# Patient Record
Sex: Male | Born: 1935 | Race: White | Hispanic: No | Marital: Married | Smoking: Former smoker
Health system: Southern US, Community
[De-identification: ages and names within clinical notes are randomized; demographics above are authoritative.]

## PROBLEM LIST (undated history)

## (undated) DIAGNOSIS — I2699 Other pulmonary embolism without acute cor pulmonale: Secondary | ICD-10-CM

## (undated) DIAGNOSIS — C61 Malignant neoplasm of prostate: Secondary | ICD-10-CM

## (undated) DIAGNOSIS — I82409 Acute embolism and thrombosis of unspecified deep veins of unspecified lower extremity: Secondary | ICD-10-CM

## (undated) DIAGNOSIS — E785 Hyperlipidemia, unspecified: Secondary | ICD-10-CM

## (undated) DIAGNOSIS — I10 Essential (primary) hypertension: Secondary | ICD-10-CM

## (undated) HISTORY — PX: APPENDECTOMY: SHX54

## (undated) HISTORY — DX: Hyperlipidemia, unspecified: E78.5

## (undated) HISTORY — PX: TRANSURETHRAL RESECTION OF PROSTATE: SHX73

## (undated) HISTORY — PX: HERNIA REPAIR: SHX51

## (undated) HISTORY — PX: TONSILLECTOMY AND ADENOIDECTOMY: SUR1326

---
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
## 1998-04-02 ENCOUNTER — Other Ambulatory Visit: Admission: RE | Admit: 1998-04-02 | Discharge: 1998-04-02 | Payer: Self-pay | Admitting: Urology

## 2004-01-20 ENCOUNTER — Inpatient Hospital Stay (HOSPITAL_COMMUNITY): Admission: RE | Admit: 2004-01-20 | Discharge: 2004-01-21 | Payer: Self-pay | Admitting: Urology

## 2004-01-20 ENCOUNTER — Encounter (INDEPENDENT_AMBULATORY_CARE_PROVIDER_SITE_OTHER): Payer: Self-pay | Admitting: Specialist

## 2005-04-18 ENCOUNTER — Ambulatory Visit: Payer: Self-pay | Admitting: Gastroenterology

## 2005-04-25 ENCOUNTER — Encounter (INDEPENDENT_AMBULATORY_CARE_PROVIDER_SITE_OTHER): Payer: Self-pay | Admitting: *Deleted

## 2005-04-25 ENCOUNTER — Ambulatory Visit: Payer: Self-pay | Admitting: Gastroenterology

## 2008-06-05 ENCOUNTER — Encounter (HOSPITAL_COMMUNITY): Admission: RE | Admit: 2008-06-05 | Discharge: 2008-07-03 | Payer: Self-pay | Admitting: Urology

## 2008-07-04 ENCOUNTER — Ambulatory Visit: Admission: RE | Admit: 2008-07-04 | Discharge: 2008-09-22 | Payer: Self-pay | Admitting: Radiation Oncology

## 2008-07-09 ENCOUNTER — Ambulatory Visit (HOSPITAL_COMMUNITY): Admission: RE | Admit: 2008-07-09 | Discharge: 2008-07-09 | Payer: Self-pay | Admitting: Urology

## 2008-09-22 ENCOUNTER — Ambulatory Visit: Admission: RE | Admit: 2008-09-22 | Discharge: 2008-10-29 | Payer: Self-pay | Admitting: Radiation Oncology

## 2009-05-20 ENCOUNTER — Ambulatory Visit: Payer: Self-pay | Admitting: Vascular Surgery

## 2011-03-01 NOTE — Procedures (Signed)
DUPLEX DEEP VENOUS EXAM - LOWER EXTREMITY   INDICATION:  Left leg edema.   HISTORY:  Edema:  Periodic episodes of left leg edema.  Trauma/Surgery:  Patient had a trauma to the left foot 10 years ago.  Pain:  No.  PE:  No.  Previous DVT:  No.  Anticoagulants:  Aspirin every other day.  Other:  No.   DUPLEX EXAM:                CFV   SFV   PopV  PTV    GSV                R  L  R  L  R  L  R   L  R  L  Thrombosis    o  o     o     +      +     o  Spontaneous   +  +     +     +      +     +  Phasic        +  +     +     +      +     +  Augmentation  +  +     +     +      +     +  Compressible  +  +     +     o      p     +  Competent     +  +     +     o      o     +   Legend:  + - yes  o - no  p - partial  D - decreased    IMPRESSION:  1. 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  A left popliteal vein is      incompetent.  The thrombus extends into the proximal third of the      calf.  2. No evidence of left leg Baker's cyst.         _____________________________  Larina Earthly, M.D.   MC/MEDQ  D:  05/20/2009  T:  05/20/2009  Job:  [id number]

## 2011-03-04 NOTE — Discharge Summary (Signed)
NAME:  Alexander Cole, Alexander Cole                     ACCOUNT NO.:  [id number]   MEDICAL RECORD NO.:  [id number]                   PATIENT TYPE:  INP   LOCATION:  0354                                 FACILITY:  [facility]   PHYSICIAN:  Jamison Neighbor, M.D.               DATE OF BIRTH:  06/21/1926   DATE OF ADMISSION:  01/20/2004  DATE OF DISCHARGE:  01/21/2004                                 DISCHARGE SUMMARY   DISCHARGE DIAGNOSES:  1. Carcinoma of the prostate.  2. Urinary retention.  3. Hypertension.   PRINCIPAL PROCEDURES:  Cystoscopy and TURP.   HISTORY:  This 77 year old male has had problems with urinary retention and  is being admitted for a treatment for his bladder outlet obstruction and for  a TURP.  The patient's Past Medical History is remarkable for hypertension,  elevated cholesterol.  Previous surgeries include surgery on the right  testicle as well as appendectomy and tonsillectomy.  His medications at the  time of admission were aspirin which had been held, Cardura, and Zocor.  Family History and Social History and Review of Systems are noncontributory.   Initial examination shows a well-healed right lower quadrant incision, is  otherwise unremarkable.  We do note enlargement of the prostate but there  was no palpable evidence of carcinoma.   The patient was felt to be ready to go to the operating room and was taken  to the OR on April 5th.  The patient underwent cystoscopy, placement of  suprapubic tube, and a TURP.  His postoperative course was unremarkable.  The patient's Foley catheter drained just barely light pink urine and a  continuous bladder irrigation was discontinued on postoperative day #1 and  he was sent home for a voiding trial.  He was sent home with the antibiotics  that he had at home as well as Lorcet Plus.  He was given instructions to  record all amounts he voided as well as all residual urines obtained by the  suprapubic tube.  He will return in  follow up and will have the tube removed  if emptying well.                                               Jamison Neighbor, M.D.    RJE/MEDQ  D:  02/03/2004  T:  02/03/2004  Job:  [id number]

## 2011-03-04 NOTE — Op Note (Signed)
NAME:  Alexander Cole, Alexander Cole                     ACCOUNT NO.:  [id number]   MEDICAL RECORD NO.:  [id number]                   PATIENT TYPE:  INP   LOCATION:  0354                                 FACILITY:  [facility]   PHYSICIAN:  Jamison Neighbor, M.D.               DATE OF BIRTH:  1926-05-31   DATE OF PROCEDURE:  01/20/2004  DATE OF DISCHARGE:                                 OPERATIVE REPORT   PREOPERATIVE DIAGNOSES:  1. Urinary retention.  2. Benign prostatic hypertrophy.   POSTOPERATIVE DIAGNOSES:  1. Urinary retention.  2. Benign prostatic hypertrophy.   PROCEDURES PERFORMED:  1. Cystoscopy.  2. Suprapubic catheter placement.  3. Urethral dilation.  4. Transurethral resection of the prostate.   SURGEON:  Dr. Marcelyn Bruins.   ASSISTANT:  Dr. Thyra Breed.   DRAINS:  1. 20 French Foley catheter as a suprapubic tube to straight drain.  2. 24 French Foley catheter to straight drain.   COMPLICATIONS:  None.   ANESTHESIA:  General endotracheal.   INDICATIONS FOR PROCEDURE:  This is a very pleasant 77 year old male, who  has a longstanding history of benign prostatic hypertrophy with bladder  outlet obstruction.  Specifically, he has been in urinary retention in the  past, requiring Foley catheter.  In addition, he has chronic urinary tract  infections due to incomplete emptying.  His current medical management is  Cardura which has helped somewhat with his bladder outlet obstruction, but  he still carries a 200-300 postvoid residual with significant lower urinary  tract symptoms.  At his most recent office appointment, he had a residual  over 1200 mL and was consented on the risks, benefits, and alternatives of  undergoing transurethral resection of the prostate with concomitant  suprapubic catheter placement for the immediate postoperative period.  The  patient understands these risks and is willing to proceed.   PROCEDURE IN DETAIL:  Following identification by his arm  bracelet, the  patient was brought to the operating room and placed in the supine position.  Here, he received preoperative IV antibiotics and underwent general  endotracheal anesthesia.  He was then moved to the dorsal lithotomy  position, and his perineum and genitalia were prepped and draped in the  usual sterile fashion.  A Lowes retractor was then inserted through the  urethral meatus and passed easily into the bladder.  With gentle upward  pressure on the retractor, the tip of the retractor was easily palpated in  the suprapubic region.  A small transverse incision was then made overlying  the tip of the Lowes retractor.  The retractor was then easily brought  through the subcutaneous fat.  The retractor was opened, and a 20 French  Foley catheter was inserted into the jaws of the retractor.  The retractor  was then brought back through the urethral meatus, and the catheter was seen  to be exiting the urethral meatus.  The 12-degree cystoscope lens was then  inserted through the 25 French sheath and used to follow the Foley catheter  through the urethral meatus and into the bladder until proper position was  visually confirmed.  Then 30 mL was used to fill the Foley balloon of the  new suprapubic tube.  Two 3-0 Ethilon stitches were then placed on either  side of the suprapubic catheter to affix it to the skin.  Following this,  portion of the procedure, Sissy Hoff urethral sounds were used to dilate the  urethral meatus to approximately 30 [place].  This allowed the urethral  meatus to accept the 28 French resectoscope sheath.  The resectoscope was  then assembled utilizing continuous flow with the fluid being Glycine.  Initial inspection of the urethra revealed normal-appearing posterior  urethra other than significant bilobar hypertrophy of the prostate producing  kissing lateral lobes.  Specifically, the right lower lobe was quite large  and protruding significantly into the  bladder.  The median lobe was quite  small in comparison to the two lateral lobes.  Inspection in the bladder  revealed severe 3+ trabeculation with a larger right-sided diverticulum as  well as several small diverticulum within the bladder consistent with the  patient's known bladder outlet obstruction.  Both the right and the left  ureteral orifice were eventually identified during the procedure and found  to be effluxing clear urine bilaterally.  Further cystoscopic evaluation  revealed no other evidence of tumors, foreign bodies, papillary lesions  within the bladder.  We then began the transurethral resection by first  making a channel through the median lobe down to the level of the  verumontanum.  After the patient had an adequate channel, we began resection  first on the right lateral prostate lobe.  In fact, the loop was used to  carefully pull the large, protruding right lateral lobe near the bladder  neck to ensure adequate resection of this portion of the prostate and avoid  damaging the bladder or the ureteral orifice.  Following extensive resection  of the right lateral lobe with coagulation to control bleeding, we were  satisfied that both the median lobe as well as the right lateral lobe were  sufficiently resected near the level of the capsule.  At this time, we  turned our attention to the left lateral lobe as resection time was  approximately 35 minutes.  We then resected the left lateral lobe in its  entirety back to the level of the verumontanum.  Following adequate  hemostasis, we then used the Toomey syringe to irrigate all prostatic chips.  The resectoscope was then reassembled, and several prostatic chips were then  gently removed from the patient's multiple diverticulum using the loop of  the resectoscope.  The Toomey syringe was again used to irrigate any  remaining fragments.  Visualization within the bladder and prostatic urethra at this time showed no remaining  prostatic chips.  With flow turned to the  off position, any other sites of active bleeding were sufficiently  coagulated.  At this time, with all flow off, there was no discernable  active bleeding within the prostatic urethra.  The resectoscope was then  removed.  A 24 French Foley catheter was then inserted with the aid of a  catheter guide.  Good return of pink urine was obtained.  Toomey syringe was  again used to irrigate the bladder until it was sufficiently clear.  Traction was then placed on the Foley catheter, and both the Foley catheter  and suprapubic tube  in continuous bladder irrigation was connected to the  suprapubic tube to drain through the Foley catheter.  This marked  termination of the procedure.  A sterile dressing was applied to the  suprapubic site.  The patient tolerated the procedure well, and there were  no complications.  Please note that Dr. Marcelyn Bruins was present and  participated in the entire procedure as he was the responsible surgeon.   DISPOSITION:  After awakening from general anesthesia, the patient was  transported to the postanesthesia care unit in stable condition.  From here,  he will be transferred to the floor for overnight observation and continuous  bladder irrigation.  If the patient's urine remains clear, the Foley  catheter will be removed in the morning, and the patient may be discharged  to home.     Thyra Breed, MD                            Jamison Neighbor, M.D.    EG/MEDQ  D:  01/20/2004  T:  01/20/2004  Job:  [id number]

## 2011-07-18 LAB — CBC
Hemoglobin: 15.1
RBC: 5.22

## 2013-03-04 ENCOUNTER — Encounter (HOSPITAL_COMMUNITY): Payer: Self-pay | Admitting: Anesthesiology

## 2013-03-04 ENCOUNTER — Inpatient Hospital Stay (HOSPITAL_COMMUNITY): Payer: Medicare Other

## 2013-03-04 ENCOUNTER — Observation Stay (HOSPITAL_COMMUNITY): Payer: Medicare Other

## 2013-03-04 ENCOUNTER — Inpatient Hospital Stay (HOSPITAL_COMMUNITY): Payer: Medicare Other | Admitting: Anesthesiology

## 2013-03-04 ENCOUNTER — Inpatient Hospital Stay (HOSPITAL_COMMUNITY)
Admission: AD | Admit: 2013-03-04 | Discharge: 2013-03-10 | DRG: 330 | Disposition: A | Discharge status: Home / Self Care | Payer: Medicare Other | Source: Ambulatory Visit | Attending: General Surgery | Admitting: General Surgery

## 2013-03-04 ENCOUNTER — Encounter (HOSPITAL_COMMUNITY): Admission: AD | Disposition: A | Discharge status: Home / Self Care | Payer: Self-pay | Source: Ambulatory Visit

## 2013-03-04 DIAGNOSIS — Z823 Family history of stroke: Secondary | ICD-10-CM

## 2013-03-04 DIAGNOSIS — Z86718 Personal history of other venous thrombosis and embolism: Secondary | ICD-10-CM

## 2013-03-04 DIAGNOSIS — Z923 Personal history of irradiation: Secondary | ICD-10-CM

## 2013-03-04 DIAGNOSIS — R112 Nausea with vomiting, unspecified: Secondary | ICD-10-CM

## 2013-03-04 DIAGNOSIS — Z79899 Other long term (current) drug therapy: Secondary | ICD-10-CM

## 2013-03-04 DIAGNOSIS — K929 Disease of digestive system, unspecified: Secondary | ICD-10-CM | POA: Diagnosis not present

## 2013-03-04 DIAGNOSIS — K56609 Unspecified intestinal obstruction, unspecified as to partial versus complete obstruction: Secondary | ICD-10-CM | POA: Diagnosis present

## 2013-03-04 DIAGNOSIS — K430 Incisional hernia with obstruction, without gangrene: Principal | ICD-10-CM | POA: Diagnosis present

## 2013-03-04 DIAGNOSIS — E876 Hypokalemia: Secondary | ICD-10-CM | POA: Diagnosis not present

## 2013-03-04 DIAGNOSIS — Y834 Other reconstructive surgery as the cause of abnormal reaction of the patient, or of later complication, without mention of misadventure at the time of the procedure: Secondary | ICD-10-CM | POA: Diagnosis present

## 2013-03-04 DIAGNOSIS — E785 Hyperlipidemia, unspecified: Secondary | ICD-10-CM | POA: Diagnosis present

## 2013-03-04 DIAGNOSIS — Z860100 Personal history of colon polyps, unspecified: Secondary | ICD-10-CM

## 2013-03-04 DIAGNOSIS — Z8249 Family history of ischemic heart disease and other diseases of the circulatory system: Secondary | ICD-10-CM

## 2013-03-04 DIAGNOSIS — R066 Hiccough: Secondary | ICD-10-CM | POA: Diagnosis not present

## 2013-03-04 DIAGNOSIS — K560 Paralytic ileus: Secondary | ICD-10-CM | POA: Diagnosis not present

## 2013-03-04 DIAGNOSIS — K565 Intestinal adhesions [bands], unspecified as to partial versus complete obstruction: Secondary | ICD-10-CM | POA: Diagnosis present

## 2013-03-04 DIAGNOSIS — R109 Unspecified abdominal pain: Secondary | ICD-10-CM

## 2013-03-04 DIAGNOSIS — Z8546 Personal history of malignant neoplasm of prostate: Secondary | ICD-10-CM

## 2013-03-04 DIAGNOSIS — N179 Acute kidney failure, unspecified: Secondary | ICD-10-CM | POA: Diagnosis present

## 2013-03-04 DIAGNOSIS — R5381 Other malaise: Secondary | ICD-10-CM | POA: Diagnosis not present

## 2013-03-04 DIAGNOSIS — Y9210 Unspecified residential institution as the place of occurrence of the external cause: Secondary | ICD-10-CM | POA: Diagnosis present

## 2013-03-04 DIAGNOSIS — R339 Retention of urine, unspecified: Secondary | ICD-10-CM | POA: Diagnosis not present

## 2013-03-04 DIAGNOSIS — I10 Essential (primary) hypertension: Secondary | ICD-10-CM | POA: Diagnosis present

## 2013-03-04 HISTORY — PX: INCISIONAL HERNIA REPAIR: SHX193

## 2013-03-04 HISTORY — DX: Essential (primary) hypertension: I10

## 2013-03-04 HISTORY — DX: Malignant neoplasm of prostate: C61

## 2013-03-04 HISTORY — DX: Acute embolism and thrombosis of unspecified deep veins of unspecified lower extremity: I82.409

## 2013-03-04 HISTORY — PX: BOWEL RESECTION: SHX1257

## 2013-03-04 HISTORY — PX: LYSIS OF ADHESION: SHX5961

## 2013-03-04 LAB — CBC WITH DIFFERENTIAL/PLATELET
Eosinophils Absolute: 0 10*3/uL (ref 0.0–0.7)
Eosinophils Relative: 0 % (ref 0–5)
HCT: 50.2 % (ref 39.0–52.0)
Hemoglobin: 17.4 g/dL — ABNORMAL HIGH (ref 13.0–17.0)
Lymphocytes Relative: 7 % — ABNORMAL LOW (ref 12–46)
Lymphs Abs: 1 10*3/uL (ref 0.7–4.0)
MCH: 29 pg (ref 26.0–34.0)
MCV: 83.7 fL (ref 78.0–100.0)
Monocytes Absolute: 2.2 10*3/uL — ABNORMAL HIGH (ref 0.1–1.0)
Monocytes Relative: 15 % — ABNORMAL HIGH (ref 3–12)
Platelets: 316 10*3/uL (ref 150–400)
RBC: 6 MIL/uL — ABNORMAL HIGH (ref 4.22–5.81)
WBC: 14.7 10*3/uL — ABNORMAL HIGH (ref 4.0–10.5)

## 2013-03-04 LAB — BASIC METABOLIC PANEL
CO2: 33 mEq/L — ABNORMAL HIGH (ref 19–32)
Calcium: 11 mg/dL — ABNORMAL HIGH (ref 8.4–10.5)
Chloride: 92 mEq/L — ABNORMAL LOW (ref 96–112)
Glucose, Bld: 137 mg/dL — ABNORMAL HIGH (ref 70–99)
Sodium: 140 mEq/L (ref 135–145)

## 2013-03-04 LAB — GLUCOSE, CAPILLARY: Glucose-Capillary: 127 mg/dL — ABNORMAL HIGH (ref 70–99)

## 2013-03-04 SURGERY — LAPAROTOMY, FOR LYSIS OF ADHESIONS
Anesthesia: General | Site: Abdomen | Wound class: Clean Contaminated

## 2013-03-04 MED ORDER — SUCCINYLCHOLINE CHLORIDE 20 MG/ML IJ SOLN
INTRAMUSCULAR | Status: DC | PRN
  Administered 2013-03-04: 120 mg via INTRAVENOUS

## 2013-03-04 MED ORDER — PROPOFOL 10 MG/ML IV BOLUS
INTRAVENOUS | Status: DC | PRN
  Administered 2013-03-04: 140 mg via INTRAVENOUS

## 2013-03-04 MED ORDER — PANTOPRAZOLE SODIUM 40 MG IV SOLR
40.0000 mg | Freq: Two times a day (BID) | INTRAVENOUS | Status: DC
  Administered 2013-03-04 – 2013-03-10 (×13): 40 mg via INTRAVENOUS
  Filled 2013-03-04 (×15): qty 40

## 2013-03-04 MED ORDER — ROCURONIUM BROMIDE 100 MG/10ML IV SOLN
INTRAVENOUS | Status: DC | PRN
  Administered 2013-03-04: 40 mg via INTRAVENOUS
  Administered 2013-03-05: 20 mg via INTRAVENOUS
  Administered 2013-03-05 (×2): 10 mg via INTRAVENOUS

## 2013-03-04 MED ORDER — DEXTROSE 5 % IV SOLN
INTRAVENOUS | Status: AC
  Filled 2013-03-04 (×2): qty 1

## 2013-03-04 MED ORDER — ONDANSETRON HCL 4 MG PO TABS: 4.0000 mg | ORAL_TABLET | Freq: Four times a day (QID) | ORAL | Status: DC | PRN

## 2013-03-04 MED ORDER — ONDANSETRON HCL 4 MG/2ML IJ SOLN: 4.0000 mg | Freq: Four times a day (QID) | INTRAMUSCULAR | Status: DC | PRN

## 2013-03-04 MED ORDER — LIDOCAINE HCL (CARDIAC) 20 MG/ML IV SOLN
INTRAVENOUS | Status: DC | PRN
  Administered 2013-03-04: 20 mg via INTRAVENOUS

## 2013-03-04 MED ORDER — LACTATED RINGERS IV SOLN
INTRAVENOUS | Status: DC | PRN
  Administered 2013-03-04 – 2013-03-05 (×2): via INTRAVENOUS

## 2013-03-04 MED ORDER — MORPHINE SULFATE 2 MG/ML IJ SOLN: 2.0000 mg | INTRAMUSCULAR | Status: DC | PRN

## 2013-03-04 MED ORDER — 0.9 % SODIUM CHLORIDE (POUR BTL) OPTIME
TOPICAL | Status: DC | PRN
  Administered 2013-03-04: 1000 mL
  Administered 2013-03-05: 2000 mL

## 2013-03-04 MED ORDER — ENOXAPARIN SODIUM 40 MG/0.4ML ~~LOC~~ SOLN
40.0000 mg | SUBCUTANEOUS | Status: DC
  Filled 2013-03-04: qty 0.4

## 2013-03-04 MED ORDER — SODIUM CHLORIDE 0.9 % IV SOLN
INTRAVENOUS | Status: DC
  Administered 2013-03-04 – 2013-03-05 (×2): via INTRAVENOUS

## 2013-03-04 SURGICAL SUPPLY — 50 items
BLADE SURG ROTATE 9660 (MISCELLANEOUS) ×3 IMPLANT
CANISTER SUCTION 2500CC (MISCELLANEOUS) ×3 IMPLANT
CHLORAPREP W/TINT 26ML (MISCELLANEOUS) ×3 IMPLANT
CLOTH BEACON ORANGE TIMEOUT ST (SAFETY) ×3 IMPLANT
COVER SURGICAL LIGHT HANDLE (MISCELLANEOUS) ×3 IMPLANT
DERMABOND ADVANCED (GAUZE/BANDAGES/DRESSINGS)
DERMABOND ADVANCED .7 DNX12 (GAUZE/BANDAGES/DRESSINGS) IMPLANT
DRAIN CHANNEL 19F RND (DRAIN) ×3 IMPLANT
DRAPE LAPAROSCOPIC ABDOMINAL (DRAPES) ×3 IMPLANT
DRAPE UTILITY 15X26 W/TAPE STR (DRAPE) ×6 IMPLANT
DRSG PAD ABDOMINAL 8X10 ST (GAUZE/BANDAGES/DRESSINGS) ×3 IMPLANT
ELECT CAUTERY BLADE 6.4 (BLADE) ×6 IMPLANT
ELECT REM PT RETURN 9FT ADLT (ELECTROSURGICAL) ×3
ELECTRODE REM PT RTRN 9FT ADLT (ELECTROSURGICAL) ×2 IMPLANT
EVACUATOR SILICONE 100CC (DRAIN) ×3 IMPLANT
GLOVE BIO SURGEON STRL SZ8 (GLOVE) ×9 IMPLANT
GLOVE BIOGEL M STRL SZ7.5 (GLOVE) ×9 IMPLANT
GLOVE BIOGEL PI IND STRL 8 (GLOVE) ×6 IMPLANT
GLOVE BIOGEL PI INDICATOR 8 (GLOVE) ×3
GOWN PREVENTION PLUS XLARGE (GOWN DISPOSABLE) ×3 IMPLANT
GOWN STRL NON-REIN LRG LVL3 (GOWN DISPOSABLE) IMPLANT
GOWN STRL REIN 3XL LVL4 (GOWN DISPOSABLE) ×3 IMPLANT
KIT BASIN OR (CUSTOM PROCEDURE TRAY) ×3 IMPLANT
KIT ROOM TURNOVER OR (KITS) ×3 IMPLANT
LIGASURE IMPACT 36 18CM CVD LR (INSTRUMENTS) ×3 IMPLANT
NEEDLE HYPO 25GX1X1/2 BEV (NEEDLE) ×3 IMPLANT
NS IRRIG 1000ML POUR BTL (IV SOLUTION) ×9 IMPLANT
PACK GENERAL/GYN (CUSTOM PROCEDURE TRAY) ×3 IMPLANT
PAD ARMBOARD 7.5X6 YLW CONV (MISCELLANEOUS) ×3 IMPLANT
RELOAD PROXIMATE 55MM GREEN (ENDOMECHANICALS) ×6 IMPLANT
SEPRAFILM PROCEDURAL PACK 3X5 (MISCELLANEOUS) ×3 IMPLANT
SPONGE GAUZE 4X4 12PLY (GAUZE/BANDAGES/DRESSINGS) ×3 IMPLANT
STAPLER PROXIMATE 55 BLUE (STAPLE) ×3 IMPLANT
STAPLER VISISTAT 35W (STAPLE) IMPLANT
SUT MNCRL AB 4-0 PS2 18 (SUTURE) IMPLANT
SUT NOVA 1 T20/GS 25DT (SUTURE) ×3 IMPLANT
SUT NOVA NAB GS-21 0 18 T12 DT (SUTURE) ×3 IMPLANT
SUT PDS AB 1 TP1 96 (SUTURE) ×6 IMPLANT
SUT PROLENE 0 CT 2 (SUTURE) ×3 IMPLANT
SUT SILK 2 0 SH CR/8 (SUTURE) ×6 IMPLANT
SUT SILK 3 0 SH CR/8 (SUTURE) ×3 IMPLANT
SUT VIC AB 3-0 SH 27 (SUTURE) ×1
SUT VIC AB 3-0 SH 27X BRD (SUTURE) ×2 IMPLANT
SYR CONTROL 10ML LL (SYRINGE) ×3 IMPLANT
TAPE CLOTH SURG 4X10 WHT LF (GAUZE/BANDAGES/DRESSINGS) ×3 IMPLANT
TOWEL OR 17X24 6PK STRL BLUE (TOWEL DISPOSABLE) ×3 IMPLANT
TOWEL OR 17X26 10 PK STRL BLUE (TOWEL DISPOSABLE) ×3 IMPLANT
TRAY FOLEY CATH 14FRSI W/METER (CATHETERS) ×3 IMPLANT
WATER STERILE IRR 1000ML POUR (IV SOLUTION) IMPLANT
YANKAUER SUCT BULB TIP NO VENT (SUCTIONS) ×3 IMPLANT

## 2013-03-04 NOTE — Anesthesia Preprocedure Evaluation (Addendum)
Anesthesia Evaluation  Patient identified by MRN, date of birth, ID band Patient awake    Reviewed: Allergy & Precautions, H&P , NPO status , Patient's Chart, lab work & pertinent test results  History of Anesthesia Complications Negative for: history of anesthetic complications  Airway Mallampati: II TM Distance: >3 FB Neck ROM: Full    Dental  (+) Dental Advisory Given and Poor Dentition   Pulmonary neg pulmonary ROS,  breath sounds clear to auscultation  Pulmonary exam normal       Cardiovascular hypertension, Pt. on medications Rhythm:Regular Rate:Normal     Neuro/Psych negative neurological ROS  negative psych ROS   GI/Hepatic Neg liver ROS, Small bowel obstruction n/v   Endo/Other  negative endocrine ROS  Renal/GU Renal InsufficiencyRenal disease (creat 1.48)   Prostate ca: surgery and XRT    Musculoskeletal   Abdominal   Peds  Hematology   Anesthesia Other Findings   Reproductive/Obstetrics                         Anesthesia Physical Anesthesia Plan  ASA: III  Anesthesia Plan: General   Post-op Pain Management:    Induction: Intravenous and Rapid sequence  Airway Management Planned: Oral ETT  Additional Equipment:   Intra-op Plan:   Post-operative Plan: Possible Post-op intubation/ventilation  Informed Consent: I have reviewed the patients History and Physical, chart, labs and discussed the procedure including the risks, benefits and alternatives for the proposed anesthesia with the patient or authorized representative who has indicated his/her understanding and acceptance.   Dental advisory given  Plan Discussed with: Surgeon and CRNA  Anesthesia Plan Comments: (Plan routine monitors, GETA )        Anesthesia Quick Evaluation

## 2013-03-04 NOTE — Progress Notes (Signed)
Subjective: Asked to f/u on CT of a/p by Dr Dwain Sarna. Pt asleep but doesn't c/o abd pain. No flatus. No urination since being at hospital. Bladder scan - 1L  Objective: Vital signs in last 24 hours: Temp:  [97.5 F (36.4 C)-98.2 F (36.8 C)] 98.2 F (36.8 C) (05/19 2058) Pulse Rate:  [102-113] 111 (05/19 2058) Resp:  [20] 20 (05/19 2058) BP: (128-133)/(70-81) 128/71 mmHg (05/19 2058) SpO2:  [92 %-95 %] 92 % (05/19 2058) Weight:  [215 lb 13.3 oz (97.9 kg)] 215 lb 13.3 oz (97.9 kg) (05/19 1414) Last BM Date: 03/03/13  Intake/Output from previous day:   Intake/Output this shift: Total I/O In: -  Out: 1000 [Emesis/NG output:1000]  Asleep, easily arousable Tachy, no fever cta  Soft, jumps a little when i press on hernia. No peritonitis. No BS. Mild distension. Subsequently doesn't grimace when palpate hernia  Lab Results:   Recent Labs  03/04/13 1933  WBC 14.7*  HGB 17.4*  HCT 50.2  PLT 316   BMET  Recent Labs  03/04/13 1933  NA 140  K 3.4*  CL 92*  CO2 33*  GLUCOSE 137*  BUN 72*  CREATININE 1.48*  CALCIUM 11.0*   PT/INR No results found for this basename: LABPROT, INR,  in the last 72 hours ABG No results found for this basename: PHART, PCO2, PO2, HCO3,  in the last 72 hours  Studies/Results: Ct Abdomen Pelvis Wo Contrast  03/04/2013   *RADIOLOGY REPORT*  Clinical Data:  Right lower quadrant pain with palpation, nausea, vomiting, question small bowel obstruction  CT ABDOMEN AND PELVIS WITHOUT CONTRAST  Technique:  Multidetector CT imaging of the abdomen and pelvis was performed following the standard protocol without intravenous contrast. Sagittal and coronal MPR images reconstructed from axial data set.  Comparison: 07/09/2008  Findings: Bibasilar atelectasis. Nasogastric tube extends into a dilated stomach. Large cyst upper pole left kidney 5.7 x 4.7 cm image 36. Liver, spleen, pancreas, kidneys, and adrenal glands otherwise unremarkable for nonenhanced  exam. Small umbilical hernia containing fat.  Small right parasagittal supraumbilical ventral hernia with fascial defect 1.9 cm diameter, through which a small bowel loop herniates. This is associated with small bowel obstruction at the herniated segment, with dilated proximal small bowel loops in decompressed distal small bowel loops. Colon is decompressed with appendix not definitely localized. Sigmoid and distal descending colonic diverticulosis without evidence of diverticulitis. Small amount of mesenteric edema is seen associated with the herniated small bowel segment as well as a small amount of subcutaneous edema in the anterolateral right abdominal wall lateral to the hernia sac.  Diverticulum at dome of urinary bladder, 6.9 cm greatest diameter. Surgical clips at prostate bed question prostatectomy. Bones appear demineralized. No mass, adenopathy, or free air.  IMPRESSION: High-grade small bowel obstruction secondary to a herniated small bowel segment within a right parasagittal supraumbilical ventral hernia with significant dilatation of the proximal small bowel loops and stomach. No evidence of perforation or definite bowel wall thickening. Large left renal cyst. Bibasilar atelectasis. Distal descending and sigmoid colonic diverticulosis. Question prior prostatectomy. 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   Original Report Authenticated By: Ulyses Southward, M.D.   Acute Abdominal Series  03/04/2013   *RADIOLOGY REPORT*  Clinical Data: Nausea and vomiting.  Question small bowel obstruction.  ACUTE ABDOMEN SERIES (ABDOMEN 2 VIEW & CHEST 1 VIEW)  Comparison: CT abdomen pelvis without contrast 07/09/2008 and CT chest 2005  Findings: The heart, mediastinal, and hilar contours are normal and stable.  Small nodular  density in the right lung apex appears stable compared to a chest radiograph of 2005, suggesting benign pleuroparenchymal scarring. There is mild bibasilar atelectasis. No airspace disease or pleural  effusion is identified.  There is no free intraperitoneal air. The stomach is massively distended with fluid and contains an air-fluid level.  There are several dilated loops of small bowel in the mid to lower abdomen, measuring up to 4.5 cm.  Relative paucity of colonic gas noted.  No dilated colon.  A cluster of multiple small radiopaque densities projects over the left abdomen at the level of the dilated stomach.  These densities were not present on prior CT abdomen of 2009. Possible ingested particles within the ankle or bowel.  No free intraperitoneal air is identified.  Degenerative changes of the lumbar spine.  IMPRESSION: 1. Findings compatible with high-grade small bowel obstruction.  It is noted that the stomach is massively distended and contains a large amount of fluid. Findings were discussed with the patient's nurse, McKenzie at 3:30 p.m. 03/04/2013 by Dr. Mayford Knife.  The clinical team is planning to place a nasogastric tube at this time. 2.  Multiple small radiopaque densities projecting over the left mid abdomen of uncertain etiology.  Question particulate matter layering within the distended stomach. 3. Bibasilar atelectasis.  Stable nodular density at the right lung apex dating back to a study of 2005 likely reflects focal pleuroparenchymal scarring.   Original Report Authenticated By: Britta Mccreedy, M.D.    Anti-infectives: Anti-infectives   None      Assessment/Plan: SBO secondary to Incarcerated ventral incisional hernia ARF Urinary retention Tachycardia  Reviewed CT - has incarcerated incisional ventral hernia. Leukocytosis is probably from hemoconcentration since Hgb 17; however, can't r/o increased wbc is due to intestinal ischemia. While pt does not have peritonitis or rebound, he is now tachycardiac. His HR in PCP's office was 98 now it is consistently 110s.   Explained to pt and wife that he will need surgery to fix hernia and intestinal blockage. He has a complete SBO - no  flatus >1 day. Explained timing of surgery is less clear - tonight vs am.  Explained that if waited til AM it is a possibility that intestines could become strangulated requiring resection. Explained safest option was to operate tonight.    I discussed the procedure in detail with pt and his wife (via phone).    We discussed the risks and benefits of surgery including, but not limited to bleeding, infection (such as wound infection, abdominal abscess), injury to surrounding structures, blood clot formation, urinary retention, incisional hernia, possible anastomotic stricture, possible anastomotic leak, anesthesia risks, pulmonary & cardiac complications such as pneumonia &/or heart attack, recurrent hernia, need for additional procedures, ileus, & prolonged hospitalization.  We discussed the typical postoperative recovery course, including limitations & restrictions postoperatively. I explained that the likelihood of improvement in their symptoms is good.  He has elected to proceed to OR this evening.  Will place foley once asleep.  IV abx on call to OR.   Alexander Cole. Alexander Campanile, MD, FACS General, Bariatric, & Minimally Invasive Surgery [facility], [place]    LOS: 0 days    Alexander Cole 03/04/2013

## 2013-03-04 NOTE — H&P (Addendum)
Vital Signs  Entered weight:  219  lbs., Calculated Weight: 219 lbs., ( 99.34 kg) Height: 72 in., ( 182.88 cm) Temperature: 97.4 deg F, Temperature site: oral Pulse rate: 98 Pulse rhythm: regular  Blood Pressure #1: 142 / 102 mm Hg    BMI: 29.70 BSA: 2.21 Wt Chg: -2.38 lbs since 07/26/2012  Vitals entered by: Leeann Must  CNA on Mar 04, 2013 12:51 PM        History of Present Illness  History from: patient Reason for visit: Work-In Sick Visit Chief Complaint: pt has had bloating and vomiting since saturday, has also had hiccupps, has not been able to eat anything , very weak and a  little disoriented , very hot and clammy yesterday  History of Present Illness: When walking the dog 3 days ago he felt like he had a lot of stomach juices in his mouth.  Tried to do yard work and ate a hamburger.  No appetite and unable to eat dinner that night.  Developed nausea and vomitting of dark black emesis that evening.  Continued for about 24 hours and then resolved last evening around 5pm.  UNable to eat today because he felt full.  Feels bloated with distended abdomen but no abdominal pain.  No flatulencce.  Last BM 2 days ago was normal.     Review of Systems  General:       Complains of anorexia, fatigue, malaise.        Denies fevers, chills, sweats, weight loss.   Ears/Nose/Throat:       Complains of decreased hearing.   Cardiovascular:       Denies chest pains, dyspnea on exertion, peripheral edema.   Respiratory:       Denies dyspnea.   Gastrointestinal:       Complains of see HPI.   Genitourinary:       Denies urinary frequency.   Musculoskeletal:       Denies joint pain.   Skin:       Denies rash.   Neurologic:       Complains of weakness, gait instability.   Endocrine:       Denies polydipsia, polyuria.   Heme/Lymphatic:       Denies bleeding.    Past History Past Medical History (reviewed - no changes required): HTN BPH S/P TURP (4/05) Hyperlipidemia ED Colon  polyp-hyperplastic (7/06) Prostate cancer-XRT (Dx 2009-XRT completed 1/10) (Gleason 4+3=7) LLE DVT- (8/10)-->Coumadin X 6 months, ?related to CA treatment or injury to leg; negative hypercoagulable panel Surgical History (reviewed - no changes required): APPY (1980s) S/P testicle lowering (1940) S/P T&A  TURP Family History (reviewed - no changes required): Father: CVA (9s), Alzheimers, pneumonia Mother: died 63; MI MGF-?Melanoma MGM-old age PGM-bowel obstruction PGF-old age? Sister-adopted, deceased Son-healthy, thyroid Son-healthy Social History (reviewed - no changes required): Married with 2 sons ([place] and [place]), no grandchildren Education: college Occupation: chemist-retired No Tobacco (college) No Alcohol. No Drugs. Enjoys golf   Physical Exam  General appearance: frail, weak  Eyes  External: conjunctivae and lids normal; no scleral icterus Pupils: equal, round, reactive to light and accommodation  Ears, Nose and Throat  External ears: normal, no lesions or deformities External nose: normal, no lesions or deformities Otoscopic: canals clear, tympanic membranes intact, no fluid Nasal: mucosa, septum, and turbinates normal Pharynx: tongue normal, protrudes mid line,  posterior pharynx without erythema or exudate  Neck  Neck: supple, no masses, trachea midline Thyroid: no nodules, masses, tenderness, or enlargement  Respiratory  Respiratory effort: no intercostal retractions or use of accessory muscles Auscultation: no rales, rhonchi, or wheezes  Cardiovascular  Auscultation: S1, S2, no murmur, rub, or gallop Carotid arteries: pulses 2+, symmetric, no bruits Pedal pulses: pulses 2+, symmetric Periph. circulation: no cyanosis, clubbing; left leg>right leg edema (chronic)  Gastrointestinal  Abdomen: distended with hypoactive bowel sounds; right mid abdominal hernia with tenderness; epigastric, left upper quadrant tenderness; no rebound or guarding Liver and  spleen: no enlargement or nodularity; negative Murphy's sign  Lymphatic  Neck: no cervical adenopathy  Musculoskeletal  Gait and station: wheelchair Digits and nails: no clubbing, cyanosis, petechiae, or nodes Head and neck: normal alignment and mobility Spine, ribs, pelvis: normal alignment and mobility, no deformity RUE: normal ROM and strength, no joint enlargement or tenderness LUE: normal ROM and strength, no joint enlargement or tenderness RLE: normal ROM and strength, no joint enlargement or tenderness LLE: normal ROM and strength, no joint enlargement or tenderness  Neurologic  Reflexes: 2+, symmetric  Mental Status Exam  Judgment, insight: intact Mood and affect: Normal Mood  Labs obtained today in office: COMPLETE METABOLIC (1010)   GLUCOSE              [H]  179 mg/dl                   45-409   BUN                  [H]  66 mg/dl                    8-11   CREATININE           [H]  1.8 mg/dl                   9.1-4.7  eGFR Non-African American                             36.1  eGFR African American                             43.7   SODIUM                    140 mEq/L                   135-148   POTASSIUM                 4.1 mEq/L                   3.5-5.3   CHLORIDE                  93 mEq/L                    80-111   CO2                       32 mEq/L                    15-35   CALCIUM              [H]  11.4 mg/dL                  8.2-95.6   TOTAL PROTEIN  7.8 g/dL                    4.6-9.6   ALBUMIN                   3.9 g/dL                    2.9-5.2   AST                       14 IU/L                     7-45   ALT                       10 IU/L                     5-40   ALK PHOS                  62 IU/L                     37-137   TOTAL BILIRUBIN           1.1 mg/dl                   8.4-1.3  Tests: (2) CBC (2000)   WBC                  [HH] 16.20 K/uL                  4.10-10.90     RES=RESULT VERIFIED AND REPORTED TO PHYSICIAN   LYM                        1.4 K/uL                    0.6-4.1 ! MID                       1.7 K/uL                    0.0-1.8   GRAN                 [H]  13.1 K/uL                   2.0-7.8   LYM%                 [L]  8.7 %                       10.0-58.5 ! MID%                      10.5 %                      0.1-24.0   GRAN%                     80.8                        37.0-92.0   RBC                       6.0 M/uL  4.2-6.3   HGB                       17.7 g/dL                   04.5-40.9   HCT                  [H]  52.9 %                      37.0-51.0   MCV                       88.0 fL                     80.0-97.0   MCH                       29.5 pg                     26.0-32.0   MCHC                      33.5 g/dL                   81.1-91.4   PLT                       413 K/uL                    140-440  Impression & Recommendations:  Problem # 1:  Small bowel obstruction (ICD-560.9) (NWG95-A21.30) He does have hypoactive bowel sounds but his history is concerning for a small bowel obstruction with bilious emesis and inability to keep down by mouth intake.  Will admit for IV fluid hydration, NPO status with NG tube and evaluation with CT of the abdomen and pelvis.  This may be related to his abdominal hernia, but he is minimally tender over this site.  Pending CT results we'll consult general surgery for further evaluation. Orders: Venipuncture ([id number]) CBC/Platelets ([id number]) CMET (SMA 12) ([id number])   Problem # 2:  Leukocytosis (ICD-288.60) (ICD10-D72.829) Concerning for an acute process though hemoconcentration may be contributing.    Problem # 3:  Dehydration (ICD-276.51) (ICD10-E86.0) CMET reveals BUN 66, Cr 1.8. Electrolytes OK.  Will admit for IV fluid hydration. Orders: Venipuncture ([id number]) CBC/Platelets ([id number]) CMET (SMA 12) ([id number])   Problem # 4:  Acute renal failure (ICD-584.9) (ICD10-N17.9) BUN 66, Cr 1.8.  Secondary to volume  depletion.  Will need CT without contrast due to ARF.    Problem # 5:  Hypercalcemia (ICD-275.42) (ICD10-E83.52) Will monitor with hydration.  Further evaluation if no improvement.    Complete Medication List: 1)  B-12 100 Mcg Tabs (Cyanocobalamin) .... One po every day 2)  Benicar Hct 20-12.5 Mg Tabs (Olmesartan medoxomil-hctz) .Marland Kitchen.. 1 tab daily 3)  Multivitamins Tabs (Multiple vitamin) .... Take one tablet by mouth every day 4)  Simvastatin 40 Mg Tabs (Simvastatin) .Marland Kitchen.. 1 tab qhs 5)  Cialis 20 Mg Tabs (Tadalafil) .... 1/2- 1 po daily prn   Comments: Patient admitted to hospital for evaluation/treatment    Electronically signed by Donnie Mesa MD on 03/04/2013 at 2:06 PM   AAS confirms high grade SBO.  Will proceed with General Surgery consult (consult requested).  CT already ordered/pending.

## 2013-03-04 NOTE — Consult Note (Signed)
Reason for Consult:abdominal pain, n,v Referring Physician: Dr Eric Form  Alexander Cole is an 79 y.o. male.  HPI: 89 yom who is fairly healthy presents with 2 d history of hiccups, nausea, a lot of vomiting (black),and bloating.  He has been unable to eat and had no appetite.  No prior history.  Feels very full.  Last bm was 2 d ago but since then has had no flatus or bm.  He has longstanding abdominal wall hernia that is present that he states is unchanged. Nothing was making this better at home which led to him coming to hospital and was admitted by Dr Clelia Croft.  PMH: HTN, hyperlipidemia, prostate cancer, history of LLE DVT  XBJ:YNWG, appy, undescended testicle, t/a  No family history on file.  Social History: negative for smoking or etoh Allergies: NKDA  Medications: I have reviewed the patient's current medications. 1) B-12 100 Mcg Tabs (Cyanocobalamin) .... One po every day  2) Benicar Hct 20-12.5 Mg Tabs (Olmesartan medoxomil-hctz) .Alexander Cole.. 1 tab daily  3) Multivitamins Tabs (Multiple vitamin) .... Take one tablet by mouth every day  4) Simvastatin 40 Mg Tabs (Simvastatin) .Alexander Cole.. 1 tab qhs  5) Cialis 20 Mg Tabs (Tadalafil) .... 1/2- 1 po daily prn   Acute Abdominal Series  03/04/2013   *RADIOLOGY REPORT*  Clinical Data: Nausea and vomiting.  Question small bowel obstruction.  ACUTE ABDOMEN SERIES (ABDOMEN 2 VIEW & CHEST 1 VIEW)  Comparison: CT abdomen pelvis without contrast 07/09/2008 and CT chest 2005  Findings: The heart, mediastinal, and hilar contours are normal and stable.  Small nodular density in the right lung apex appears stable compared to a chest radiograph of 2005, suggesting benign pleuroparenchymal scarring. There is mild bibasilar atelectasis. No airspace disease or pleural effusion is identified.  There is no free intraperitoneal air. The stomach is massively distended with fluid and contains an air-fluid level.  There are several dilated loops of small bowel in the mid to  lower abdomen, measuring up to 4.5 cm.  Relative paucity of colonic gas noted.  No dilated colon.  A cluster of multiple small radiopaque densities projects over the left abdomen at the level of the dilated stomach.  These densities were not present on prior CT abdomen of 2009. Possible ingested particles within the ankle or bowel.  No free intraperitoneal air is identified.  Degenerative changes of the lumbar spine.  IMPRESSION: 1. Findings compatible with high-grade small bowel obstruction.  It is noted that the stomach is massively distended and contains a large amount of fluid. Findings were discussed with the patient's nurse, McKenzie at 3:30 p.m. 03/04/2013 by Dr. Mayford Knife.  The clinical team is planning to place a nasogastric tube at this time. 2.  Multiple small radiopaque densities projecting over the left mid abdomen of uncertain etiology.  Question particulate matter layering within the distended stomach. 3. Bibasilar atelectasis.  Stable nodular density at the right lung apex dating back to a study of 2005 likely reflects focal pleuroparenchymal scarring.   Original Report Authenticated By: Britta Mccreedy, M.D.    Review of Systems  Constitutional: Negative for fever and chills.  Respiratory: Negative for cough and shortness of breath.   Cardiovascular: Negative for chest pain and palpitations.  Gastrointestinal: Positive for nausea and vomiting. Negative for abdominal pain, diarrhea and blood in stool.   Blood pressure 133/81, pulse 113, temperature 97.9 F (36.6 C), temperature source Oral, resp. rate 20, height 6\' 2"  (1.88 m), weight 215 lb 13.3 oz (97.9  kg), SpO2 95.00%. Physical Exam  Vitals reviewed. Constitutional: He is oriented to person, place, and time. He appears well-developed and well-nourished.  HENT:  Head: Normocephalic and atraumatic.  Eyes: No scleral icterus.  Neck: Neck supple.  Cardiovascular: Regular rhythm and normal heart sounds.  Tachycardia present.   No murmur  heard. Respiratory: Effort normal and breath sounds normal. He has no wheezes. He has no rales.  GI: He exhibits distension. Bowel sounds are absent. There is no tenderness. A hernia is present. Hernia confirmed positive in the ventral area.    Lymphadenopathy:    He has no cervical adenopathy.  Neurological: He is alert and oriented to person, place, and time.    Assessment/Plan: Likely sbo  He just had ng placed with 2.6 liters of dark material out initially. He feels somewhat better already.  I am concerned this could be related to hernia although this is not tender and he states it is unchanged.  I think hydration, ng suction, npo and awaiting ct scan is reasonable.  Labs are pending and I think making sure lytes and cr are ok is good idea as well.  If this is from hernia will need surgery. If adhesions then can attempt conservative mgt.  Discussed plan with patient.  Boomer Winders 03/04/2013, 4:55 PM

## 2013-03-04 NOTE — Progress Notes (Signed)
Pt admitted to unit 6700 from Dr. Margarito Liner. Shaw's office. Pt is alert and oriented to staff, call bell, and room. Bed in lowest position. Call bell within reach. Full assessment to Epic. Will continue to monitor. Fayne Norrie, RN

## 2013-03-04 NOTE — Progress Notes (Signed)
Patient with >999 urine in bladder per bladder scan,Dr Andrey Campanile at bedside.Per MD,pt is going for surgery tonight and foley catheter will be placed in OR. Latissa Frick Joselita,RN

## 2013-03-05 ENCOUNTER — Encounter (HOSPITAL_COMMUNITY): Payer: Self-pay | Admitting: General Surgery

## 2013-03-05 DIAGNOSIS — N179 Acute kidney failure, unspecified: Secondary | ICD-10-CM | POA: Diagnosis present

## 2013-03-05 DIAGNOSIS — K56609 Unspecified intestinal obstruction, unspecified as to partial versus complete obstruction: Secondary | ICD-10-CM | POA: Diagnosis present

## 2013-03-05 DIAGNOSIS — I10 Essential (primary) hypertension: Secondary | ICD-10-CM | POA: Diagnosis present

## 2013-03-05 DIAGNOSIS — K430 Incisional hernia with obstruction, without gangrene: Secondary | ICD-10-CM

## 2013-03-05 DIAGNOSIS — D1330 Benign neoplasm of unspecified part of small intestine: Secondary | ICD-10-CM

## 2013-03-05 DIAGNOSIS — Z86718 Personal history of other venous thrombosis and embolism: Secondary | ICD-10-CM

## 2013-03-05 LAB — BASIC METABOLIC PANEL
BUN: 61 mg/dL — ABNORMAL HIGH (ref 6–23)
CO2: 30 mEq/L (ref 19–32)
Chloride: 99 mEq/L (ref 96–112)
Creatinine, Ser: 1.15 mg/dL (ref 0.50–1.35)
Potassium: 3.4 mEq/L — ABNORMAL LOW (ref 3.5–5.1)

## 2013-03-05 LAB — CBC
HCT: 43.6 % (ref 39.0–52.0)
MCV: 83.8 fL (ref 78.0–100.0)
Platelets: 237 10*3/uL (ref 150–400)
RBC: 5.2 MIL/uL (ref 4.22–5.81)
WBC: 6.7 10*3/uL (ref 4.0–10.5)

## 2013-03-05 LAB — SURGICAL PCR SCREEN
MRSA, PCR: NEGATIVE
Staphylococcus aureus: NEGATIVE

## 2013-03-05 MED ORDER — NEOSTIGMINE METHYLSULFATE 1 MG/ML IJ SOLN
INTRAMUSCULAR | Status: DC | PRN
  Administered 2013-03-05: 4 mg via INTRAVENOUS

## 2013-03-05 MED ORDER — FENTANYL CITRATE 0.05 MG/ML IJ SOLN
INTRAMUSCULAR | Status: DC | PRN
  Administered 2013-03-05 (×4): 50 ug via INTRAVENOUS

## 2013-03-05 MED ORDER — MORPHINE SULFATE 2 MG/ML IJ SOLN
1.0000 mg | INTRAMUSCULAR | Status: DC | PRN
  Administered 2013-03-05: 2 mg via INTRAVENOUS
  Filled 2013-03-05: qty 2
  Filled 2013-03-05: qty 1

## 2013-03-05 MED ORDER — MENTHOL 3 MG MT LOZG: 1.0000 | LOZENGE | OROMUCOSAL | Status: DC | PRN

## 2013-03-05 MED ORDER — DEXTROSE 5 % IV SOLN
2.0000 g | INTRAVENOUS | Status: DC | PRN
  Administered 2013-03-04: 2 g via INTRAVENOUS

## 2013-03-05 MED ORDER — CHLORPROMAZINE HCL 25 MG/ML IJ SOLN
12.5000 mg | Freq: Four times a day (QID) | INTRAMUSCULAR | Status: DC | PRN
  Administered 2013-03-05 – 2013-03-07 (×4): 12.5 mg via INTRAVENOUS
  Filled 2013-03-05 (×6): qty 0.5

## 2013-03-05 MED ORDER — POTASSIUM CHLORIDE 10 MEQ/100ML IV SOLN
10.0000 meq | INTRAVENOUS | Status: AC
  Administered 2013-03-05 (×4): 10 meq via INTRAVENOUS
  Filled 2013-03-05: qty 200

## 2013-03-05 MED ORDER — CHLORHEXIDINE GLUCONATE 0.12 % MT SOLN
15.0000 mL | Freq: Two times a day (BID) | OROMUCOSAL | Status: DC
  Administered 2013-03-05 – 2013-03-10 (×10): 15 mL via OROMUCOSAL
  Filled 2013-03-05 (×10): qty 15

## 2013-03-05 MED ORDER — SODIUM CHLORIDE 0.9 % IV SOLN
10.0000 mg | INTRAVENOUS | Status: DC | PRN
  Administered 2013-03-04: 50 ug/min via INTRAVENOUS

## 2013-03-05 MED ORDER — ACETAMINOPHEN 10 MG/ML IV SOLN: 1000.0000 mg | Freq: Once | INTRAVENOUS | Status: DC | PRN

## 2013-03-05 MED ORDER — PHENYLEPHRINE HCL 10 MG/ML IJ SOLN
INTRAMUSCULAR | Status: DC | PRN
  Administered 2013-03-04: 80 ug via INTRAVENOUS

## 2013-03-05 MED ORDER — ALBUMIN HUMAN 5 % IV SOLN
INTRAVENOUS | Status: DC | PRN
  Administered 2013-03-05 (×2): via INTRAVENOUS

## 2013-03-05 MED ORDER — ACETAMINOPHEN 10 MG/ML IV SOLN
1000.0000 mg | Freq: Four times a day (QID) | INTRAVENOUS | Status: AC
  Administered 2013-03-05 (×4): 1000 mg via INTRAVENOUS
  Filled 2013-03-05 (×6): qty 100

## 2013-03-05 MED ORDER — DROPERIDOL 2.5 MG/ML IJ SOLN: 0.6250 mg | INTRAMUSCULAR | Status: DC | PRN

## 2013-03-05 MED ORDER — FENTANYL CITRATE 0.05 MG/ML IJ SOLN: 25.0000 ug | INTRAMUSCULAR | Status: DC | PRN

## 2013-03-05 MED ORDER — KCL IN DEXTROSE-NACL 20-5-0.45 MEQ/L-%-% IV SOLN
INTRAVENOUS | Status: DC
  Administered 2013-03-05 – 2013-03-09 (×9): via INTRAVENOUS
  Filled 2013-03-05 (×13): qty 1000

## 2013-03-05 MED ORDER — ONDANSETRON HCL 4 MG/2ML IJ SOLN
INTRAMUSCULAR | Status: DC | PRN
  Administered 2013-03-05: 4 mg via INTRAVENOUS

## 2013-03-05 MED ORDER — PHENOL 1.4 % MT LIQD
1.0000 | OROMUCOSAL | Status: DC | PRN
  Filled 2013-03-05: qty 177

## 2013-03-05 MED ORDER — GLYCOPYRROLATE 0.2 MG/ML IJ SOLN
INTRAMUSCULAR | Status: DC | PRN
  Administered 2013-03-05: .6 mg via INTRAVENOUS

## 2013-03-05 MED ORDER — ENOXAPARIN SODIUM 40 MG/0.4ML ~~LOC~~ SOLN
40.0000 mg | SUBCUTANEOUS | Status: DC
  Administered 2013-03-05 – 2013-03-10 (×6): 40 mg via SUBCUTANEOUS
  Filled 2013-03-05 (×6): qty 0.4

## 2013-03-05 MED ORDER — BIOTENE DRY MOUTH MT LIQD
15.0000 mL | Freq: Two times a day (BID) | OROMUCOSAL | Status: DC
  Administered 2013-03-05 – 2013-03-09 (×8): 15 mL via OROMUCOSAL

## 2013-03-05 NOTE — Progress Notes (Signed)
UR Completed.  Alexander Cole Jane [phone] 03/05/2013  

## 2013-03-05 NOTE — Anesthesia Postprocedure Evaluation (Signed)
  Anesthesia Post-op Note  Patient: Alexander Cole  Procedure(s) Performed: Procedure(s): Strangulated HERNIA REPAIR INCISIONAL (N/A) LYSIS OF ADHESION SMALL BOWEL RESECTION  Patient Location: PACU  Anesthesia Type:General  Level of Consciousness: awake, alert  and patient cooperative  Airway and Oxygen Therapy: Patient Spontanous Breathing and Patient connected to nasal cannula oxygen  Post-op Pain: mild  Post-op Assessment: Post-op Vital signs reviewed, Patient's Cardiovascular Status Stable, Respiratory Function Stable, Patent Airway, No signs of Nausea or vomiting and Pain level controlled  Post-op Vital Signs: Reviewed and stable  Complications: No apparent anesthesia complications

## 2013-03-05 NOTE — Progress Notes (Signed)
Report called to 2300 RN,patient is going to 2313. Javonne Dorko Joselita,RN

## 2013-03-05 NOTE — Progress Notes (Signed)
Subjective: Went to OR after CT confirmed SBO secondary to incarcerated incisional hernia.  States that pain is moderate but adequately controlled.  Hiccoughs for the past hour.     Objective: Vital signs in last 24 hours: Temp:  [97.5 F (36.4 C)-98.2 F (36.8 C)] 97.6 F (36.4 C) (05/20 0303) Pulse Rate:  [66-113] 66 (05/20 0600) Resp:  [14-20] 18 (05/20 0600) BP: (111-170)/(45-81) 111/50 mmHg (05/20 0600) SpO2:  [92 %-100 %] 99 % (05/20 0600) Weight:  [97.9 kg (215 lb 13.3 oz)-99.973 kg (220 lb 6.4 oz)] 99.973 kg (220 lb 6.4 oz) (05/20 0303) Weight change:  Last BM Date: 03/03/13  CBG (last 3)   Recent Labs  03/04/13 1655  GLUCAP 127*    Intake/Output from previous day: 05/19 0701 - 05/20 0700 In: 3460 [I.V.:2800; NG/GT:60; IV Piggyback:600] Out: 6510 [Urine:1660; Emesis/NG output:4850] Intake/Output this shift:    General appearance: alert and persistent hiccoughs Eyes: no scleral icterus Throat: oropharynx moist without erythema Resp: clear to auscultation bilaterally Cardio: regular rate and rhythm GI: soft, non-tender; bowel sounds normal; no masses,  no organomegaly Extremities: no clubbing, cyanosis or edema; SCDs in place   Lab Results:  Recent Labs  03/04/13 1933 03/05/13 0420  NA 140 141  K 3.4* 3.4*  CL 92* 99  CO2 33* 30  GLUCOSE 137* 132*  BUN 72* 61*  CREATININE 1.48* 1.15  CALCIUM 11.0* 9.6     Recent Labs  03/04/13 1933 03/05/13 0420  WBC 14.7* 6.7  NEUTROABS 11.6*  --   HGB 17.4* 14.8  HCT 50.2 43.6  MCV 83.7 83.8  PLT 316 237    Studies/Results: Ct Abdomen Pelvis Wo Contrast  03/04/2013   *RADIOLOGY REPORT*  Clinical Data:  Right lower quadrant pain with palpation, nausea, vomiting, question small bowel obstruction  CT ABDOMEN AND PELVIS WITHOUT CONTRAST  Technique:  Multidetector CT imaging of the abdomen and pelvis was performed following the standard protocol without intravenous contrast. Sagittal and coronal MPR images  reconstructed from axial data set.  Comparison: 07/09/2008  Findings: Bibasilar atelectasis. Nasogastric tube extends into a dilated stomach. Large cyst upper pole left kidney 5.7 x 4.7 cm image 36. Liver, spleen, pancreas, kidneys, and adrenal glands otherwise unremarkable for nonenhanced exam. Small umbilical hernia containing fat.  Small right parasagittal supraumbilical ventral hernia with fascial defect 1.9 cm diameter, through which a small bowel loop herniates. This is associated with small bowel obstruction at the herniated segment, with dilated proximal small bowel loops in decompressed distal small bowel loops. Colon is decompressed with appendix not definitely localized. Sigmoid and distal descending colonic diverticulosis without evidence of diverticulitis. Small amount of mesenteric edema is seen associated with the herniated small bowel segment as well as a small amount of subcutaneous edema in the anterolateral right abdominal wall lateral to the hernia sac.  Diverticulum at dome of urinary bladder, 6.9 cm greatest diameter. Surgical clips at prostate bed question prostatectomy. Bones appear demineralized. No mass, adenopathy, or free air.  IMPRESSION: High-grade small bowel obstruction secondary to a herniated small bowel segment within a right parasagittal supraumbilical ventral hernia with significant dilatation of the proximal small bowel loops and stomach. No evidence of perforation or definite bowel wall thickening. Large left renal cyst. Bibasilar atelectasis. Distal descending and sigmoid colonic diverticulosis. Question prior prostatectomy. 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   Original Report Authenticated By: Ulyses Southward, M.D.   Acute Abdominal Series  03/04/2013   *RADIOLOGY REPORT*  Clinical Data: Nausea and vomiting.  Question small bowel obstruction.  ACUTE ABDOMEN SERIES (ABDOMEN 2 VIEW & CHEST 1 VIEW)  Comparison: CT abdomen pelvis without contrast 07/09/2008 and CT chest 2005   Findings: The heart, mediastinal, and hilar contours are normal and stable.  Small nodular density in the right lung apex appears stable compared to a chest radiograph of 2005, suggesting benign pleuroparenchymal scarring. There is mild bibasilar atelectasis. No airspace disease or pleural effusion is identified.  There is no free intraperitoneal air. The stomach is massively distended with fluid and contains an air-fluid level.  There are several dilated loops of small bowel in the mid to lower abdomen, measuring up to 4.5 cm.  Relative paucity of colonic gas noted.  No dilated colon.  A cluster of multiple small radiopaque densities projects over the left abdomen at the level of the dilated stomach.  These densities were not present on prior CT abdomen of 2009. Possible ingested particles within the ankle or bowel.  No free intraperitoneal air is identified.  Degenerative changes of the lumbar spine.  IMPRESSION: 1. Findings compatible with high-grade small bowel obstruction.  It is noted that the stomach is massively distended and contains a large amount of fluid. Findings were discussed with the patient's nurse, McKenzie at 3:30 p.m. 03/04/2013 by Dr. Mayford Knife.  The clinical team is planning to place a nasogastric tube at this time. 2.  Multiple small radiopaque densities projecting over the left mid abdomen of uncertain etiology.  Question particulate matter layering within the distended stomach. 3. Bibasilar atelectasis.  Stable nodular density at the right lung apex dating back to a study of 2005 likely reflects focal pleuroparenchymal scarring.   Original Report Authenticated By: Britta Mccreedy, M.D.     Medications: Scheduled: . acetaminophen  1,000 mg Intravenous Q6H  . cefOXitin (MEFOXIN) IVPB 1 gram/50 mL D5W (Pyxis)      . enoxaparin (LOVENOX) injection  40 mg Subcutaneous Q24H  . pantoprazole (PROTONIX) IV  40 mg Intravenous Q12H   Continuous: . sodium chloride 125 mL/hr at 03/05/13 0320     Assessment/Plan: Principal Problem: 1. Small bowel obstruction secondary to incarcerated incisional hernia- Improved after repair- Appreciate prompt surgical management.  Post-op care per [facility].  Active Problems: 2.  Hiccoughs- likely secondary to diaphragmatic irritation- will try Thorazine 12.5mg  IV q6 hours.   3. Acute renal failure- improving with hydration.  Monitor BMET. 4. Hypokalemia- replete with KCL IV X 4. 5. Essential hypertension, benign- BP control is adequate.  Continue to hold Benicar HCT due to ARF.   6. History of DVT of lower extremity- high risk for recurrent DVT.  Resume Lovenox for DVT prophylaxis ASAP.  Defer to [facility]- Op note recommends holding for >24 hours due to risk of blood loss.  SCDs in place. 7. Disposition- per general surgery.  Will start PT soon.  Anticipate that he can return home with his wife once stable and tolerating pos.    LOS: 1 day   Dalina Samara,W DOUGLAS 03/05/2013, 7:28 AM

## 2013-03-05 NOTE — Brief Op Note (Addendum)
03/04/2013 - 03/05/2013  1:35 AM  PATIENT:  Alexander Cole  79 y.o. male  PRE-OPERATIVE DIAGNOSIS:  Incarcerated Incisional Hernia  POST-OPERATIVE DIAGNOSIS:  1) Strangulated Incisional Hernia 2) distal small bowel mass  PROCEDURE:  Procedure(s): Strangulated HERNIA REPAIR INCISIONAL (N/A) LYSIS OF ADHESION SMALL BOWEL RESECTION  SURGEON:  Surgeon(s) and Role:    * Atilano Ina, MD - Primary  PHYSICIAN ASSISTANT: none  ASSISTANTS: none   ANESTHESIA:   general  EBL:  Total I/O In: 2200 [I.V.:1700; IV Piggyback:500] Out: 2060 [Urine:1060; Emesis/NG output:1000]  BLOOD ADMINISTERED:none  DRAINS: (19 Fr) Jackson-Pratt drain(s) with closed bulb suction in the subcutaneous tissue, Nasogastric Tube and Urinary Catheter (Foley)   FINDINGS: incarcerated small bowel along with necrotic omentum in ventral incisional hernia. SB viable. Distal to point of obstruction in distal SB came across what appeared to be diverticulum coming off of SB. Performed small segmental SB resection with side-side anastomosis.   Type of repair - primary suture  (choices - primary suture, mesh, or component)  Name of mesh - N/A  LOCAL MEDICATIONS USED:  NONE  SPECIMEN:  Source of Specimen:  distal small bowel mass (?meckel's diverticulum)  DISPOSITION OF SPECIMEN:  PATHOLOGY  COUNTS:  YES  TOURNIQUET:  * No tourniquets in log *  DICTATION: .Other Dictation: Dictation Number [id number]  PLAN OF CARE: PACU then ICU  PATIENT DISPOSITION:  PACU - hemodynamically stable.   Delay start of Pharmacological VTE agent (>24hrs) due to surgical blood loss or risk of bleeding: no  Mary Sella. Andrey Campanile, MD, FACS General, Bariatric, & Minimally Invasive Surgery [facility], [place]

## 2013-03-05 NOTE — Progress Notes (Signed)
Attempted to call patient's wife to inform her of patient's new room no.,phone rings but nobody answers. Alexander Cole Joselita,RN

## 2013-03-05 NOTE — Progress Notes (Signed)
Transferred to 6N11 via wheelchair accompanied by wife and NA. Alert and oriented, on 2 liters O2. No untoward signs and symptoms noted and no untoward event happened during transport. Receiving RN at bedside.

## 2013-03-05 NOTE — Progress Notes (Signed)
1 Day Post-Op  Subjective: Feels ok postop  Objective: Vital signs in last 24 hours: Temp:  [97.5 F (36.4 C)-98.2 F (36.8 C)] 97.9 F (36.6 C) (05/20 0747) Pulse Rate:  [65-113] 65 (05/20 0700) Resp:  [14-20] 19 (05/20 0700) BP: (111-170)/(45-81) 128/48 mmHg (05/20 0700) SpO2:  [92 %-100 %] 98 % (05/20 0700) Weight:  [215 lb 13.3 oz (97.9 kg)-220 lb 6.4 oz (99.973 kg)] 220 lb 6.4 oz (99.973 kg) (05/20 0303) Last BM Date: 03/03/13  Intake/Output from previous day: 05/19 0701 - 05/20 0700 In: 3460 [I.V.:2800; NG/GT:60; IV Piggyback:600] Out: 6510 [Urine:1660; Emesis/NG output:4850] Intake/Output this shift:    General appearance: no distress Resp: clear to auscultation bilaterally Cardio: regular rate and rhythm GI: dressing dry, drain serous, no bs, approp tender  Lab Results:   Recent Labs  03/04/13 1933 03/05/13 0420  WBC 14.7* 6.7  HGB 17.4* 14.8  HCT 50.2 43.6  PLT 316 237   BMET  Recent Labs  03/04/13 1933 03/05/13 0420  NA 140 141  K 3.4* 3.4*  CL 92* 99  CO2 33* 30  GLUCOSE 137* 132*  BUN 72* 61*  CREATININE 1.48* 1.15  CALCIUM 11.0* 9.6   PT/INR No results found for this basename: LABPROT, INR,  in the last 72 hours ABG No results found for this basename: PHART, PCO2, PO2, HCO3,  in the last 72 hours  Studies/Results: Ct Abdomen Pelvis Wo Contrast  03/04/2013   *RADIOLOGY REPORT*  Clinical Data:  Right lower quadrant pain with palpation, nausea, vomiting, question small bowel obstruction  CT ABDOMEN AND PELVIS WITHOUT CONTRAST  Technique:  Multidetector CT imaging of the abdomen and pelvis was performed following the standard protocol without intravenous contrast. Sagittal and coronal MPR images reconstructed from axial data set.  Comparison: 07/09/2008  Findings: Bibasilar atelectasis. Nasogastric tube extends into a dilated stomach. Large cyst upper pole left kidney 5.7 x 4.7 cm image 36. Liver, spleen, pancreas, kidneys, and adrenal glands  otherwise unremarkable for nonenhanced exam. Small umbilical hernia containing fat.  Small right parasagittal supraumbilical ventral hernia with fascial defect 1.9 cm diameter, through which a small bowel loop herniates. This is associated with small bowel obstruction at the herniated segment, with dilated proximal small bowel loops in decompressed distal small bowel loops. Colon is decompressed with appendix not definitely localized. Sigmoid and distal descending colonic diverticulosis without evidence of diverticulitis. Small amount of mesenteric edema is seen associated with the herniated small bowel segment as well as a small amount of subcutaneous edema in the anterolateral right abdominal wall lateral to the hernia sac.  Diverticulum at dome of urinary bladder, 6.9 cm greatest diameter. Surgical clips at prostate bed question prostatectomy. Bones appear demineralized. No mass, adenopathy, or free air.  IMPRESSION: High-grade small bowel obstruction secondary to a herniated small bowel segment within a right parasagittal supraumbilical ventral hernia with significant dilatation of the proximal small bowel loops and stomach. No evidence of perforation or definite bowel wall thickening. Large left renal cyst. Bibasilar atelectasis. Distal descending and sigmoid colonic diverticulosis. Question prior prostatectomy. 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   Original Report Authenticated By: Ulyses Southward, M.D.   Acute Abdominal Series  03/04/2013   *RADIOLOGY REPORT*  Clinical Data: Nausea and vomiting.  Question small bowel obstruction.  ACUTE ABDOMEN SERIES (ABDOMEN 2 VIEW & CHEST 1 VIEW)  Comparison: CT abdomen pelvis without contrast 07/09/2008 and CT chest 2005  Findings: The heart, mediastinal, and hilar contours are normal and stable.  Small nodular  density in the right lung apex appears stable compared to a chest radiograph of 2005, suggesting benign pleuroparenchymal scarring. There is mild bibasilar  atelectasis. No airspace disease or pleural effusion is identified.  There is no free intraperitoneal air. The stomach is massively distended with fluid and contains an air-fluid level.  There are several dilated loops of small bowel in the mid to lower abdomen, measuring up to 4.5 cm.  Relative paucity of colonic gas noted.  No dilated colon.  A cluster of multiple small radiopaque densities projects over the left abdomen at the level of the dilated stomach.  These densities were not present on prior CT abdomen of 2009. Possible ingested particles within the ankle or bowel.  No free intraperitoneal air is identified.  Degenerative changes of the lumbar spine.  IMPRESSION: 1. Findings compatible with high-grade small bowel obstruction.  It is noted that the stomach is massively distended and contains a large amount of fluid. Findings were discussed with the patient's nurse, McKenzie at 3:30 p.m. 03/04/2013 by Dr. Mayford Knife.  The clinical team is planning to place a nasogastric tube at this time. 2.  Multiple small radiopaque densities projecting over the left mid abdomen of uncertain etiology.  Question particulate matter layering within the distended stomach. 3. Bibasilar atelectasis.  Stable nodular density at the right lung apex dating back to a study of 2005 likely reflects focal pleuroparenchymal scarring.   Original Report Authenticated By: Britta Mccreedy, M.D.     Assessment/Plan: POD 1 elap, sbr 1. Neuro- cont morphine for pain control, thorazine for hiccups 2. Cv/pulm- aggressive pulm toilet today, oob 3. Gi- expect prolonged ileus, cont ng tube, will let have a few ice chips 4. Follow cr, follow uop, add k to iv fluids 5. Start lovenox, scds 6 tx to floor   [facility] 03/05/2013

## 2013-03-05 NOTE — Op Note (Signed)
Alexander Cole, Alexander Cole           ACCOUNT NO.:  [id number]  MEDICAL RECORD NO.:  [id number]  LOCATION:  2313                         FACILITY:  [facility]  PHYSICIAN:  Mary Sella. Andrey Campanile, MD, FACSDATE OF BIRTH:  09/27/1926  DATE OF PROCEDURE:  03/05/2013 DATE OF DISCHARGE:                              OPERATIVE REPORT   PREOPERATIVE DIAGNOSIS:  Incarcerated ventral incisional hernia.  POSTOPERATIVE DIAGNOSES: 1. Strangulated ventral incisional hernia. 2. Distal small bowel mass.  PROCEDURE: 1. Open primary repair of strangulated incisional hernia. 2. Lysis of adhesions for 30 minutes. 3. Small bowel resection with side-to-side anastomosis.  SURGEON:  Mary Sella. Andrey Campanile, MD, FACS  ASSISTANT SURGEON:  None.  ANESTHESIA:  General.  ESTIMATED BLOOD LOSS:  Minimal.  SPECIMEN:  Distal small bowel mass (question Meckel's diverticulum).  DRAINS:  19-French JP drain in the subcutaneous tissue, NG tube, and Foley catheter.  INDICATIONS FOR PROCEDURE:  The patient is an pleasant 79 year old gentleman who has had a known ventral incisional hernia who presented to the hospital earlier today on the 19th with bloating and vomiting since Saturday.  He was unable to eat anything.  He has been having multiple episodes of vomiting and no flatus.  His last bowel movement was 2 days ago.  Plain film of his abdomen demonstrated a high-grade partial small bowel obstruction.  He also appeared to be dehydrated based on his labs. He is admitted by his primary care physician, underwent a CT scan of his abdomen and pelvis which demonstrated that the point of obstruction was in fact his ventral hernia.  There was a significant small bowel distention proximal to the hernia and the small bowel distal to the incisional hernia was decompressed.  I went up and assessed him, although he was afebrile.  He was now tachycardic.  He had, had a normal heart rate in his physician's office early on the 19th, but now he  was in the low 100.  When I pressed over his incisional hernia, he jumped. He did not have a rigid abdomen or rebound, but given the fact of his tachycardia, my initial exam and the finding of significant small bowel distention in the fact that he had been obstipated for 2 days.  I felt the safest course of action to recommend to the patient was to proceed to the operating room tonight as supposed to waiting in the morning.  I discussed the risks and benefits at length with the patient including his wife via telephone.  We discussed the risks and benefits, including, but not limited to, bleeding, infection, injury to surrounding structures, possible bowel resection if ended up with bowel resection, anastomotic stricture, anastomotic leakage, blood clot formation, anesthesia concerns, recurrent hernia formation, wound complications, anesthesia complications, prolonged ileus, prolonged hospitalization.  I explained that if he did have to resect intestine, that I would not put mesh again and he would be at higher risk for hernia recurrence.  The patient elected to proceed to surgery.  DESCRIPTION OF PROCEDURE:  After obtaining informed consent, the patient was taken to the operating room 2 at [facility].  The patient already drained approximately 3 L of a thick dark green contents from his nasogastric tube.  He had not urinated since being admitted to the hospital.  He was placed supine on the operating table and general endotracheal anesthesia was established.  A Foley catheter was placed and got over a 1L of urine.  Sequential compression devices were placed. His abdomen was prepped and draped in the usual standard surgical fashion with ChloraPrep.  A surgical time-out was performed.  He received IV antibiotics prior to skin incision.  I excised his old upper midline incision which extended below his umbilicus sharply with a #10 blade.  I then divided the  subcutaneous tissue with electrocautery.  I got down to the fascia in the lower midline just below the umbilicus.  I removed several old interrupted of what appeared to be PDS 0 Prolene sutures.  Kochers were placed on the fascia, and I was able to be entered to the abdominal cavity.  I did a finger sweep and I did not feel any adhesions around the umbilicus. There was a small hernia at the level of the umbilicus.  I continued opening up the fascia towards the upper midline.  There was some omental adhesions to the anterior midline and these were taken down with the electrocautery.  I got to the incisional hernia which was in his upper abdomen to the right of the midline.  I was able to fully open the fascia.  The old interrupted sutures were discarded.  At this point, Kochers were placed on the right at the edge of the abdominal wall fascia.  There was omentum going into the incisional hernia as well as small bowel.  I divided some of the omentum in order to make it easier to reduce the small bowel.  It was the mid small bowel that was incarcerated into the incisional hernia.  It was hyperemic, but viable. He then had a decent amount of omentum also within the hernia and this was reduced and this omentum was nonviable, it was strangulated, and necrotic.  It was excised with the LigaSure device until healthy omentum was encountered.  I then reinspected the knuckle of small bowel that had been incarcerated.  It appeared to be patent.  There did not appear to be any intrinsic stricture and it had pinked up at this point.  I then ran the remaining small bowel distally- it was completely decompressed, but as I was getting toward the distal small bowel in the ileum, I came across a mass coming off the lumen of the small bowel.  It appeared to almost to be a diverticulum.  The mesentery in the area appeared normal.  I continued to run the small bowel down to the TI into the cecum.  I inspected  this area again.  It appeared to be about 2 feet from the ileocecal valve which suggested maybe a Meckel's.  It was abnormal and appeared to be a potential site for a potential obstruction, therefore, I elected to do a small segmental bowel resection.  A small mesenteric window was created just proximal to the mass and the bowel was divided with a GIA stapler 55 linear cutter. Again, a small defect was made in the mesentery distal to the mass. Another fire of the GIA 55 linear cutter stapler was made.  I then took down the mesentery in a serial fashion using the ligature device.  This freed the specimen.  I then brought the small bowel together in a side- by-side fashion.  The corner of the staple line was cut off from each end.  I then placed one fork of the linear cutter GIA stapler through each enterotomy and brought the stapler together and fired to create a common channel.  I closed the defect with interrupted 2-0 silk sutures.  A 3-0 silk suture was used as a tension relieving suture in the crotch of the anastomosis.  I then closed the small mesenteric defect with interrupted 2-0 silk sutures.  The staple line was hemostatic.  There was no evidence of leak from common closure.  We then exchanged gloves and changed out the blue towels.  The abdomen was copiously irrigated.  At this point, I placed Kochers on the right side of the fascia and lifted the skin and subcutaneous tissue off the fascia primarily starting at the level of the umbilicus and going up toward the upper midline in order to mobilize the fascia and ordering it back toward the midline. The actual hernia defect itself was only about 2-3 cm in width.  I then also created some small flaps on the left side minimal.  At this point, the fascia was able to be reapproximated.  Six pieces of Seprafilm were placed in the abdomen.  I then reapproximated the fascia with 2 running looped #1 PDS 1 from above, 1 from below, and I  placed interrupted internal tension relieving sutures using #1 Novafil pops.  Because I had raised some small subcutaneous flap, I did place a 19-French round JP in the subcutaneous tissue on top of the fascial closure and brought it out through the skin and secured it with a 2-0 Prolene.  The 4x4s and sterile dressings were applied.  All needle, instrument, and sponge counts were correct x2.  Anesthesia had plans to extubate the patient and got to PACU and then to the ICU.     Mary Sella. Andrey Campanile, MD, FACS     EMW/MEDQ  D:  03/05/2013  T:  03/05/2013  Job:  [id number]

## 2013-03-05 NOTE — Anesthesia Procedure Notes (Signed)
Procedure Name: Intubation Date/Time: 03/04/2013 11:39 PM Performed by: Molli Hazard Pre-anesthesia Checklist: Patient identified, Emergency Drugs available, Suction available and Patient being monitored Patient Re-evaluated:Patient Re-evaluated prior to inductionOxygen Delivery Method: Circle system utilized Preoxygenation: Pre-oxygenation with 100% oxygen Intubation Type: IV induction, Rapid sequence and Cricoid Pressure applied Laryngoscope Size: Miller and 2 Grade View: Grade I Tube type: Oral Tube size: 7.5 mm Number of attempts: 1 Airway Equipment and Method: Stylet Placement Confirmation: ETT inserted through vocal cords under direct vision,  positive ETCO2 and breath sounds checked- equal and bilateral Secured at: 22 cm Tube secured with: Tape Dental Injury: Teeth and Oropharynx as per pre-operative assessment

## 2013-03-05 NOTE — Progress Notes (Signed)
Called wife after surgery and gave update as well as intraop findings.   Alexander Cole. Andrey Campanile, MD, FACS General, Bariatric, & Minimally Invasive Surgery [facility], [place]

## 2013-03-05 NOTE — Transfer of Care (Signed)
Immediate Anesthesia Transfer of Care Note  Patient: Alexander Cole  Procedure(s) Performed: Procedure(s): Strangulated HERNIA REPAIR INCISIONAL (N/A) LYSIS OF ADHESION SMALL BOWEL RESECTION  Patient Location: PACU  Anesthesia Type:General  Level of Consciousness: awake and patient cooperative  Airway & Oxygen Therapy: Patient Spontanous Breathing and Patient connected to face mask oxygen  Post-op Assessment: Report given to PACU RN, Post -op Vital signs reviewed and stable and Patient moving all extremities X 4  Post vital signs: Reviewed and stable  Complications: No apparent anesthesia complications

## 2013-03-06 DIAGNOSIS — R5381 Other malaise: Secondary | ICD-10-CM

## 2013-03-06 DIAGNOSIS — E876 Hypokalemia: Secondary | ICD-10-CM

## 2013-03-06 MED ORDER — POTASSIUM CHLORIDE 10 MEQ/100ML IV SOLN
10.0000 meq | INTRAVENOUS | Status: AC
  Administered 2013-03-06 (×4): 10 meq via INTRAVENOUS
  Filled 2013-03-06: qty 100

## 2013-03-06 NOTE — Progress Notes (Signed)
lovenox for dvt proph, agree with above

## 2013-03-06 NOTE — Progress Notes (Signed)
Patient ID: Alexander Cole, male   DOB: 08-22-1926, 79 y.o.   MRN: [id number] 2 Days Post-Op  Subjective: Pt feels ok.  Pain well controlled.  No flatus or BM  Objective: Vital signs in last 24 hours: Temp:  [97.3 F (36.3 C)-98.6 F (37 C)] 98.1 F (36.7 C) (05/21 0500) Pulse Rate:  [84-97] 97 (05/21 0500) Resp:  [15-20] 19 (05/21 0500) BP: (104-124)/(45-67) 124/65 mmHg (05/21 0500) SpO2:  [94 %-100 %] 95 % (05/21 0500) Weight:  [215 lb 9.8 oz (97.8 kg)] 215 lb 9.8 oz (97.8 kg) (05/20 1100) Last BM Date: 03/03/13  Intake/Output from previous day: 05/20 0701 - 05/21 0700 In: 3026.7 [I.V.:2371.7; NG/GT:130; IV Piggyback:325] Out: 2675 [Urine:1250; Emesis/NG output:1400; Drains:25] Intake/Output this shift:    PE: Abd: soft, minimally tender, incision c/d/i with staples, drain with serosang output. NGT with bilious output Heart: regular  Lab Results:   Recent Labs  03/04/13 1933 03/05/13 0420  WBC 14.7* 6.7  HGB 17.4* 14.8  HCT 50.2 43.6  PLT 316 237   BMET  Recent Labs  03/04/13 1933 03/05/13 0420  NA 140 141  K 3.4* 3.4*  CL 92* 99  CO2 33* 30  GLUCOSE 137* 132*  BUN 72* 61*  CREATININE 1.48* 1.15  CALCIUM 11.0* 9.6   PT/INR No results found for this basename: LABPROT, INR,  in the last 72 hours CMP     Component Value Date/Time   NA 141 03/05/2013 0420   K 3.4* 03/05/2013 0420   CL 99 03/05/2013 0420   CO2 30 03/05/2013 0420   GLUCOSE 132* 03/05/2013 0420   BUN 61* 03/05/2013 0420   CREATININE 1.15 03/05/2013 0420   CALCIUM 9.6 03/05/2013 0420   GFRNONAA 57* 03/05/2013 0420   GFRAA 66* 03/05/2013 0420   Lipase  No results found for this basename: lipase       Studies/Results: Ct Abdomen Pelvis Wo Contrast  03/04/2013   *RADIOLOGY REPORT*  Clinical Data:  Right lower quadrant pain with palpation, nausea, vomiting, question small bowel obstruction  CT ABDOMEN AND PELVIS WITHOUT CONTRAST  Technique:  Multidetector CT imaging of the abdomen and  pelvis was performed following the standard protocol without intravenous contrast. Sagittal and coronal MPR images reconstructed from axial data set.  Comparison: 07/09/2008  Findings: Bibasilar atelectasis. Nasogastric tube extends into a dilated stomach. Large cyst upper pole left kidney 5.7 x 4.7 cm image 36. Liver, spleen, pancreas, kidneys, and adrenal glands otherwise unremarkable for nonenhanced exam. Small umbilical hernia containing fat.  Small right parasagittal supraumbilical ventral hernia with fascial defect 1.9 cm diameter, through which a small bowel loop herniates. This is associated with small bowel obstruction at the herniated segment, with dilated proximal small bowel loops in decompressed distal small bowel loops. Colon is decompressed with appendix not definitely localized. Sigmoid and distal descending colonic diverticulosis without evidence of diverticulitis. Small amount of mesenteric edema is seen associated with the herniated small bowel segment as well as a small amount of subcutaneous edema in the anterolateral right abdominal wall lateral to the hernia sac.  Diverticulum at dome of urinary bladder, 6.9 cm greatest diameter. Surgical clips at prostate bed question prostatectomy. Bones appear demineralized. No mass, adenopathy, or free air.  IMPRESSION: High-grade small bowel obstruction secondary to a herniated small bowel segment within a right parasagittal supraumbilical ventral hernia with significant dilatation of the proximal small bowel loops and stomach. No evidence of perforation or definite bowel wall thickening. Large left renal cyst. Bibasilar  atelectasis. Distal descending and sigmoid colonic diverticulosis. Question prior prostatectomy. 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   Original Report Authenticated By: Ulyses Southward, M.D.   Acute Abdominal Series  03/04/2013   *RADIOLOGY REPORT*  Clinical Data: Nausea and vomiting.  Question small bowel obstruction.  ACUTE ABDOMEN SERIES  (ABDOMEN 2 VIEW & CHEST 1 VIEW)  Comparison: CT abdomen pelvis without contrast 07/09/2008 and CT chest 2005  Findings: The heart, mediastinal, and hilar contours are normal and stable.  Small nodular density in the right lung apex appears stable compared to a chest radiograph of 2005, suggesting benign pleuroparenchymal scarring. There is mild bibasilar atelectasis. No airspace disease or pleural effusion is identified.  There is no free intraperitoneal air. The stomach is massively distended with fluid and contains an air-fluid level.  There are several dilated loops of small bowel in the mid to lower abdomen, measuring up to 4.5 cm.  Relative paucity of colonic gas noted.  No dilated colon.  A cluster of multiple small radiopaque densities projects over the left abdomen at the level of the dilated stomach.  These densities were not present on prior CT abdomen of 2009. Possible ingested particles within the ankle or bowel.  No free intraperitoneal air is identified.  Degenerative changes of the lumbar spine.  IMPRESSION: 1. Findings compatible with high-grade small bowel obstruction.  It is noted that the stomach is massively distended and contains a large amount of fluid. Findings were discussed with the patient's nurse, McKenzie at 3:30 p.m. 03/04/2013 by Dr. Mayford Knife.  The clinical team is planning to place a nasogastric tube at this time. 2.  Multiple small radiopaque densities projecting over the left mid abdomen of uncertain etiology.  Question particulate matter layering within the distended stomach. 3. Bibasilar atelectasis.  Stable nodular density at the right lung apex dating back to a study of 2005 likely reflects focal pleuroparenchymal scarring.   Original Report Authenticated By: Britta Mccreedy, M.D.    Anti-infectives: Anti-infectives   Start     Dose/Rate Route Frequency Ordered Stop   03/04/13 2327  dextrose 5 % with cefOXitin (MEFOXIN) ADS Med    Comments:  [name]: cabinet override       03/04/13 2327 03/05/13 1129       Assessment/Plan  1. S/p incisional hernia repair 2. Post op ileus  3. Hypokalemia 4. Deconditioning  Plan: 1. Cont NGT and await post op ileus 2. PT/OT for disposition planning 3. Replace K, recheck BMET in am 4. Mobilize and pulm toilet  LOS: 2 days    Talbert Trembath E 03/06/2013, 9:18 AM Pager: [phone]

## 2013-03-07 LAB — BASIC METABOLIC PANEL
BUN: 26 mg/dL — ABNORMAL HIGH (ref 6–23)
Chloride: 103 mEq/L (ref 96–112)
GFR calc Af Amer: >90 mL/min (ref >90)
GFR calc non Af Amer: 78 mL/min — ABNORMAL LOW (ref >90)
Potassium: 3.6 mEq/L (ref 3.5–5.1)
Sodium: 141 mEq/L (ref 135–145)

## 2013-03-07 NOTE — Evaluation (Signed)
Physical Therapy Evaluation Patient Details Name: Alexander Cole MRN: [id number] DOB: September 07, 1926 Today's Date: 03/07/2013 Time: 4098-1191 PT Time Calculation (min): 18 min  PT Assessment / Plan / Recommendation Clinical Impression  79 y.o. male admitted for repair of strangulated hernia, LOA, and small bowel resection. Pt was independent with mobility PTA. Today he walked 300' pushing IV pole. Expect pt will be able to DC home without assistive device and without f/u PT. He would benefit from acute PT to maximize safety and independence with mobility.     PT Assessment  Patient needs continued PT services    Follow Up Recommendations  No PT follow up    Does the patient have the potential to tolerate intense rehabilitation      Barriers to Discharge None      Equipment Recommendations  None recommended by PT    Recommendations for Other Services     Frequency      Precautions / Restrictions Precautions Precautions: None Restrictions Weight Bearing Restrictions: No   Pertinent Vitals/Pain *3/10 incisional pain SaO2 94% on RA walking, HR 88**      Mobility  Bed Mobility Bed Mobility: Not assessed Transfers Transfers: Sit to Stand;Stand to Sit Sit to Stand: 5: Supervision;From chair/3-in-1;With upper extremity assist;With armrests Stand to Sit: 5: Supervision;To chair/3-in-1;With upper extremity assist;With armrests Ambulation/Gait Ambulation/Gait Assistance: 6: Modified independent (Device/Increase time) Ambulation Distance (Feet): 300 Feet Assistive device: Other (Comment) (IV pole) Gait Pattern: Within Functional Limits Gait velocity: WFL General Gait Details: VCs to correct flexed neck posture    Exercises     PT Diagnosis: Acute pain;Difficulty walking  PT Problem List: Pain;Decreased activity tolerance;Decreased mobility PT Treatment Interventions: Gait training;Stair training   PT Goals Acute Rehab PT Goals PT Goal Formulation: With  patient/family Time For Goal Achievement: 03/21/13 Potential to Achieve Goals: Good Pt will go Supine/Side to Sit: with modified independence;with HOB 0 degrees;with rail PT Goal: Supine/Side to Sit - Progress: Goal set today Pt will Ambulate: >150 feet;Independently PT Goal: Ambulate - Progress: Goal set today Pt will Go Up / Down Stairs: 3-5 stairs;with supervision PT Goal: Up/Down Stairs - Progress: Goal set today  Visit Information  Last PT Received On: 03/07/13 Assistance Needed: +1    Subjective Data  Subjective: I'm just sore where they cut me.  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   Prior Functioning  Home Living Lives With: Spouse Available Help at Discharge: Family;Available 24 hours/day Type of Home: House Home Access: Stairs to enter [employer] of Steps: 3 Home Layout: Multi-level (split level) Home Adaptive Equipment: Straight cane Prior Function Level of Independence: Independent Able to Take Stairs?: Yes Driving: Yes Vocation: Retired Musician: No difficulties    Copywriter, advertising Arousal/Alertness: Awake/alert Behavior During Therapy: WFL for tasks assessed/performed Overall Cognitive Status: Within Functional Limits for tasks assessed    Extremity/Trunk Assessment Right Upper Extremity Assessment RUE ROM/Strength/Tone: WFL for tasks assessed Left Upper Extremity Assessment LUE ROM/Strength/Tone: WFL for tasks assessed Right Lower Extremity Assessment RLE ROM/Strength/Tone: Within functional levels RLE Sensation: WFL - Light Touch RLE Coordination: WFL - gross/fine motor Left Lower Extremity Assessment LLE ROM/Strength/Tone: Within functional levels LLE Sensation: WFL - Light Touch LLE Coordination: WFL - gross/fine motor Trunk Assessment Trunk Assessment: Normal   Balance    End of Session PT - End of Session Activity Tolerance: Patient tolerated treatment well Patient left:  in chair;with call bell/phone within reach;with family/visitor present Nurse Communication: Mobility status  GP     Ralene Bathe Kistler 03/07/2013, 11:37 AM [phone]

## 2013-03-07 NOTE — Progress Notes (Signed)
PCP Courtesy Visit: Patient is progressing appropriately.  Pain controlled.  Still no flatus.  NG remains in place.  ARF has resolved.  On Lovenox and SCDs for DVT prophylaxis.  Greatly appreciate General Surgery management.  Will continue to follow peripherally but am available if medical issues arise.

## 2013-03-07 NOTE — Progress Notes (Signed)
Agree with above, discussed path as benign

## 2013-03-07 NOTE — Progress Notes (Signed)
Patient ID: Alexander Cole, male   DOB: 04-19-1926, 79 y.o.   MRN: [id number] 3 Days Post-Op  Subjective: Pt feels well today except for hiccups.  No flatus yet, but mobilizing well.  Objective: Vital signs in last 24 hours: Temp:  [97.6 F (36.4 C)-98.6 F (37 C)] 98.1 F (36.7 C) (05/22 0612) Pulse Rate:  [75-89] 75 (05/22 0612) Resp:  [15-19] 18 (05/22 0612) BP: (113-140)/(50-63) 122/55 mmHg (05/22 0612) SpO2:  [95 %-97 %] 95 % (05/22 0612) Last BM Date: 03/03/13  Intake/Output from previous day: 05/21 0701 - 05/22 0700 In: 2591.9 [I.V.:2441.9] Out: 2570 [Urine:1550; Emesis/NG output:1000; Drains:20] Intake/Output this shift: Total I/O In: 451.7 [I.V.:376.7; IV Piggyback:75] Out: -   PE: Abd: soft, +BS, ND, appropriately tender, incision c/d/i with staples, JP with serosang output.  NGT with thin bilious output, watered down.  Lab Results:   Recent Labs  03/04/13 1933 03/05/13 0420  WBC 14.7* 6.7  HGB 17.4* 14.8  HCT 50.2 43.6  PLT 316 237   BMET  Recent Labs  03/05/13 0420 03/07/13 0535  NA 141 141  K 3.4* 3.6  CL 99 103  CO2 30 30  GLUCOSE 132* 118*  BUN 61* 26*  CREATININE 1.15 0.85  CALCIUM 9.6 9.1   PT/INR No results found for this basename: LABPROT, INR,  in the last 72 hours CMP     Component Value Date/Time   NA 141 03/07/2013 0535   K 3.6 03/07/2013 0535   CL 103 03/07/2013 0535   CO2 30 03/07/2013 0535   GLUCOSE 118* 03/07/2013 0535   BUN 26* 03/07/2013 0535   CREATININE 0.85 03/07/2013 0535   CALCIUM 9.1 03/07/2013 0535   GFRNONAA 78* 03/07/2013 0535   GFRAA >90 03/07/2013 0535   Lipase  No results found for this basename: lipase       Studies/Results: No results found.  Anti-infectives: Anti-infectives   Start     Dose/Rate Route Frequency Ordered Stop   03/04/13 2327  dextrose 5 % with cefOXitin (MEFOXIN) ADS Med    Comments:  Alexander Cole: cabinet override      03/04/13 2327 03/05/13 1129       Assessment/Plan  1. S/p incisional hernia repair with SBR  2. Post op ileus, improving 3. Deconditioning, mobilizing well. 4. Urinary retention  Plan: 1. The patient has good bowel sounds, but has not passed flatus yet.  He is mobilizing well.  We will clamp his NGT tube for 6 hrs.  If he tolerates this, it can be discontinued and left NPO. 2. Cont foley today for urinary retention.  Will dc in am and start flomax if needed. 3. Cont to await bowel function.  LOS: 3 days    Alexander Cole E 03/07/2013, 11:17 AM Pager: [phone]

## 2013-03-07 NOTE — Evaluation (Signed)
Occupational Therapy Evaluation and Discharge Patient Details Name: Alexander Cole MRN: [id number] DOB: 1926-08-22 Today's Date: 03/07/2013 Time: 1550-1610 OT Time Calculation (min): 20 min  OT Assessment / Plan / Recommendation Clinical Impression  This 79 yo male admitted due to incarcerated hernia and now s/p repair presents to acute OT with all education completed. Acute OT will sign off.    OT Assessment  Patient does not need any further OT services    Follow Up Recommendations  No OT follow up       Equipment Recommendations  None recommended by OT          Precautions / Restrictions Precautions Precaution Comments: abdominal incision, NGtube Restrictions Weight Bearing Restrictions: No    Pt will mild DOE at end of session (O2 96% on RA)   ADL  Eating/Feeding: Simulated;Independent Where Assessed - Eating/Feeding: Chair Grooming: Simulated;Set up Where Assessed - Grooming: Unsupported sitting Upper Body Bathing: Simulated;Set up Where Assessed - Upper Body Bathing: Unsupported sitting Lower Body Bathing: Simulated;Moderate assistance Where Assessed - Lower Body Bathing: Unsupported sit to stand Upper Body Dressing: Simulated;Moderate assistance Where Assessed - Upper Body Dressing: Unsupported sitting Lower Body Dressing: Simulated;+1 Total assistance Where Assessed - Lower Body Dressing: Unsupported sit to stand Toilet Transfer: Performed;Min guard Toilet Transfer Method: Sit to Barista: Comfort height toilet;Grab bars Toileting - Clothing Manipulation and Hygiene: Simulated;Minimal assistance Where Assessed - Engineer, mining and Hygiene: Standing Equipment Used:  (Pt pushed IV pole) Transfers/Ambulation Related to ADLs: Min guard A for all ADL Comments: Pt cannot currently bend forward to get to his feet due to abdominal pain, nor can he cross his legs to get to his feet. Talked with him about using his shower  seat that he has at home since this will take less energy than standing to shower since pt does exhibit DOE. Made him aware that the surgeon needs to OK him to shower. He reports that his wife can A him with LB ADLs until he can do them again.          Visit Information  Last OT Received On: 03/07/13 Assistance Needed: +1       Prior Functioning     Home Living Lives With: Spouse Available Help at Discharge: Family;Available 24 hours/day Type of Home: House Home Access: Stairs to enter [employer] of Steps: 3 Home Layout: Multi-level Bathroom Shower/Tub: Tub/shower unit;Door [employer] Toilet: Handicapped height Home Adaptive Equipment: Paediatric nurse with back;Straight cane;Hand-held shower hose Prior Function Level of Independence: Independent Able to Take Stairs?: Yes Driving: Yes Vocation: Retired Musician: No difficulties Dominant Hand: Right            Cognition  Cognition Arousal/Alertness: Awake/alert Behavior During Therapy: WFL for tasks assessed/performed Overall Cognitive Status: Within Functional Limits for tasks assessed       Mobility Bed Mobility Bed Mobility: Sit to Supine Sit to Supine: With rail;5: Supervision Transfers Transfers: Sit to Stand;Stand to Sit Sit to Stand: 4: Min guard;With upper extremity assist;With armrests;From chair/3-in-1 Stand to Sit: 4: Min guard;With upper extremity assist;To bed           End of Session OT - End of Session Activity Tolerance:  (Mild DOE) Patient left: in bed;with call bell/phone within reach       Evette Georges [phone] 03/07/2013, 4:21 PM

## 2013-03-08 MED ORDER — TAMSULOSIN HCL 0.4 MG PO CAPS
0.4000 mg | ORAL_CAPSULE | Freq: Every day | ORAL | Status: DC
  Administered 2013-03-08 – 2013-03-10 (×3): 0.4 mg via ORAL
  Filled 2013-03-08 (×3): qty 1

## 2013-03-08 NOTE — Progress Notes (Signed)
Pt reports that he was straining trying to have a bowel movement and a small about of blood came out of his penis. RN inspected, small amount of blood. Pt denies any pain. Will continue to monitor.

## 2013-03-08 NOTE — Progress Notes (Signed)
Foley D/C'd at 1142, output measuring 500 mL   NG tube removed also around 1142, data entry error for time charted of removal

## 2013-03-08 NOTE — Progress Notes (Signed)
4 Days Post-Op  Subjective: Passing flatus and in bathroom this am trying to have a bm. Complains of soreness but otherwise ok  Objective: Vital signs in last 24 hours: Temp:  [98.2 F (36.8 C)-99.2 F (37.3 C)] 98.2 F (36.8 C) (05/23 0520) Pulse Rate:  [71-76] 71 (05/23 0520) Resp:  [20] 20 (05/23 0520) BP: (141-154)/(58-67) 154/58 mmHg (05/23 0520) SpO2:  [95 %-97 %] 95 % (05/23 0520) Last BM Date: 03/03/13  Intake/Output from previous day: 05/22 0701 - 05/23 0700 In: 2931.7 [P.O.:360; I.V.:2376.7; IV Piggyback:75] Out: 1785 [Urine:1325; Emesis/NG output:450; Drains:10] Intake/Output this shift:    GI: soft, good bs  Lab Results:  No results found for this basename: WBC, HGB, HCT, PLT,  in the last 72 hours BMET  Recent Labs  03/07/13 0535  NA 141  K 3.6  CL 103  CO2 30  GLUCOSE 118*  BUN 26*  CREATININE 0.85  CALCIUM 9.1   PT/INR No results found for this basename: LABPROT, INR,  in the last 72 hours ABG No results found for this basename: PHART, PCO2, PO2, HCO3,  in the last 72 hours  Studies/Results: No results found.  Anti-infectives: Anti-infectives   Start     Dose/Rate Route Frequency Ordered Stop   03/04/13 2327  dextrose 5 % with cefOXitin (MEFOXIN) ADS Med    Comments:  [name]: cabinet override      03/04/13 2327 03/05/13 1129      Assessment/Plan: s/p Procedure(s): Strangulated HERNIA REPAIR INCISIONAL (N/A) LYSIS OF ADHESION SMALL BOWEL RESECTION D/C NG Allow sips of clears Ambulate  LOS: 4 days    TOTH III,PAUL S 03/08/2013

## 2013-03-08 NOTE — Progress Notes (Signed)
4540 Patient more alert oriented to person and time. Patient states he does not take restoril every night. Patient resting heart rate 107 O2 sat 97 on 2l/m [place]. Will continue to monitor

## 2013-03-09 MED ORDER — OXYCODONE-ACETAMINOPHEN 5-325 MG PO TABS: 1.0000 | ORAL_TABLET | ORAL | Status: DC | PRN

## 2013-03-09 NOTE — Progress Notes (Signed)
5 Days Post-Op  Subjective: Moving bowels.  No pain  Objective: Vital signs in last 24 hours: Temp:  [97.7 F (36.5 C)-99.4 F (37.4 C)] 98.4 F (36.9 C) (05/24 0542) Pulse Rate:  [72-94] 75 (05/24 0542) Resp:  [18-19] 19 (05/24 0542) BP: (114-137)/(56-65) 133/57 mmHg (05/24 0542) SpO2:  [96 %-98 %] 97 % (05/24 0542) Last BM Date: 03/03/13  Intake/Output from previous day: 05/23 0701 - 05/24 0700 In: 1748 [P.O.:420; I.V.:1328] Out: 855 [Urine:850; Drains:5] Intake/Output this shift:    Incision/Wound:clean,  Soft abdomen with BS  Lab Results:  No results found for this basename: WBC, HGB, HCT, PLT,  in the last 72 hours BMET  Recent Labs  03/07/13 0535  NA 141  K 3.6  CL 103  CO2 30  GLUCOSE 118*  BUN 26*  CREATININE 0.85  CALCIUM 9.1   PT/INR No results found for this basename: LABPROT, INR,  in the last 72 hours ABG No results found for this basename: PHART, PCO2, PO2, HCO3,  in the last 72 hours  Studies/Results: No results found.  Anti-infectives: Anti-infectives   Start     Dose/Rate Route Frequency Ordered Stop   03/04/13 2327  dextrose 5 % with cefOXitin (MEFOXIN) ADS Med    Comments:  [name]: cabinet override      03/04/13 2327 03/05/13 1129      Assessment/Plan: s/p Procedure(s): Strangulated HERNIA REPAIR INCISIONAL (N/A) LYSIS OF ADHESION SMALL BOWEL RESECTION Advance diet  LOS: 5 days    Alexander Cole A. 03/09/2013

## 2013-03-10 MED ORDER — HYDROCODONE-ACETAMINOPHEN 5-325 MG PO TABS: 2.0000 | ORAL_TABLET | ORAL | Status: DC | PRN

## 2013-03-10 NOTE — Progress Notes (Signed)
Pt discharged to home. DC instructions given with wife and male family member at bedside. No concerns voiced. Prescription x 1 given. dsg change done prior to DC. jp drain also removed by this RN prior to DC. Tubing intact. Gauze dsg applied to site. Pt tolerated procedure. Left unit in wheelchair pushed by nurse tech accompanied by family members. Left in good condition. Vwilliams,rn.

## 2013-03-10 NOTE — Progress Notes (Signed)
6 Days Post-Op  Subjective: Moving bowels and tolerating diet.  No pain  Objective: Vital signs in last 24 hours: Temp:  [98 F (36.7 C)-99.1 F (37.3 C)] 99.1 F (37.3 C) (05/25 0548) Pulse Rate:  [72-83] 74 (05/25 0548) Resp:  [16-17] 17 (05/25 0548) BP: (112-134)/(55-59) 112/59 mmHg (05/25 0548) SpO2:  [96 %-98 %] 96 % (05/25 0548) Last BM Date: 03/03/13  Intake/Output from previous day: 05/24 0701 - 05/25 0700 In: 2626.7 [P.O.:270; I.V.:2346.7] Out: 1903 [Urine:1900; Drains:3] Intake/Output this shift:    Incision/Wound:C/D/I JP min  Lab Results:  No results found for this basename: WBC, HGB, HCT, PLT,  in the last 72 hours BMET No results found for this basename: NA, K, CL, CO2, GLUCOSE, BUN, CREATININE, CALCIUM,  in the last 72 hours PT/INR No results found for this basename: LABPROT, INR,  in the last 72 hours ABG No results found for this basename: PHART, PCO2, PO2, HCO3,  in the last 72 hours  Studies/Results: No results found.  Anti-infectives: Anti-infectives   Start     Dose/Rate Route Frequency Ordered Stop   03/04/13 2327  dextrose 5 % with cefOXitin (MEFOXIN) ADS Med    Comments:  [name]: cabinet override      03/04/13 2327 03/05/13 1129      Assessment/Plan: s/p Procedure(s): Strangulated HERNIA REPAIR INCISIONAL (N/A) LYSIS OF ADHESION SMALL BOWEL RESECTION Discharge  LOS: 6 days    King Pinzon A. 03/10/2013

## 2013-03-10 NOTE — Discharge Summary (Signed)
Physician Discharge Summary  Patient ID: Alexander Cole MRN: [id number] DOB/AGE: [birth year] 79 y.o.  Admit date: 03/04/2013 Discharge date: 03/10/2013  Admission Diagnoses: Patient Active Problem List   Diagnosis Date Noted  . Incarcerated incisional hernia 03/05/2013  . Small bowel obstruction 03/05/2013  . Acute renal failure 03/05/2013  . Essential hypertension, benign 03/05/2013  . History of DVT of lower extremity 03/05/2013    Discharge Diagnoses:  Principal Problem:   Incarcerated incisional hernia Active Problems:   Small bowel obstruction   Acute renal failure   Essential hypertension, benign   History of DVT of lower extremity   Discharged Condition: good  Hospital Course: pt had post op course that was unremarkable.  Bowel function returned in timely fashion,  Diet advanced and pain control excellent. jp removed prior to discharge.   Consults: None  Significant Diagnostic Studies: labs:  CBC    Component Value Date/Time   WBC 6.7 03/05/2013 0420   RBC 5.20 03/05/2013 0420   HGB 14.8 03/05/2013 0420   HCT 43.6 03/05/2013 0420   PLT 237 03/05/2013 0420   MCV 83.8 03/05/2013 0420   MCH 28.5 03/05/2013 0420   MCHC 33.9 03/05/2013 0420   RDW 13.8 03/05/2013 0420   LYMPHSABS 1.0 03/04/2013 1933   MONOABS 2.2* 03/04/2013 1933   EOSABS 0.0 03/04/2013 1933   BASOSABS 0.0 03/04/2013 1933     Treatments: surgery: repiar incisional hernia.  Discharge Exam: Blood pressure 112/59, pulse 74, temperature 99.1 F (37.3 C), temperature source Oral, resp. rate 17, height 6\' 2"  (1.88 m), weight 215 lb 9.8 oz (97.8 kg), SpO2 96.00%. Incision/Wound:clean dry intact.   Soft not distended.  Disposition:  home    Medication List    ASK your doctor about these medications       cholecalciferol 1000 UNITS tablet  Commonly known as:  VITAMIN D  Take 1,000 Units by mouth daily.     multivitamin with minerals Tabs  Take 1 tablet by mouth daily.     olmesartan-hydrochlorothiazide 20-12.5 MG per tablet  Commonly known as:  BENICAR HCT  Take 1 tablet by mouth daily.     simvastatin 40 MG tablet  Commonly known as:  ZOCOR  Take 40 mg by mouth at bedtime.     vitamin C 500 MG tablet  Commonly known as:  ASCORBIC ACID  Take 500 mg by mouth daily as needed (for oncoming cold symptoms).         Signed: Kaniesha Barile A. 03/10/2013, 8:42 AM

## 2013-03-10 NOTE — Plan of Care (Signed)
Problem: Discharge Progression Outcomes Goal: Staples/sutures removed Outcome: Not Met (add Reason) Staples to be removed outpatient at f/u visit; per MD

## 2013-03-21 ENCOUNTER — Encounter (INDEPENDENT_AMBULATORY_CARE_PROVIDER_SITE_OTHER): Payer: Self-pay | Admitting: General Surgery

## 2013-03-21 ENCOUNTER — Ambulatory Visit (INDEPENDENT_AMBULATORY_CARE_PROVIDER_SITE_OTHER): Payer: Medicare Other | Admitting: General Surgery

## 2013-03-21 VITALS — BP 124/82 | HR 68 | Temp 98.0°F | Resp 18 | Ht 74.0 in | Wt 212.0 lb

## 2013-03-21 DIAGNOSIS — Z09 Encounter for follow-up examination after completed treatment for conditions other than malignant neoplasm: Secondary | ICD-10-CM

## 2013-03-21 NOTE — Progress Notes (Signed)
Subjective:     Patient ID: Alexander Cole, male   DOB: May 14, 1926, 79 y.o.   MRN: [id number]  HPI 79 year old gentleman comes in today for his first postoperative appointment. He underwent urgent open primary repair of a strangulated incisional hernia, lysis of adhesions, and small bowel resection on May 20. The small bowel resection was performed primarily for an incidental small bowel tumor found distal to the site of his obstruction. 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 His acute renal failure resolved. He states he is doing well. He is accompanied by his wife. He denies any fever, chills, nausea, vomiting, diarrhea constipation. He is taking stool softeners. He did not take any pain medication after discharge. He reports a good appetite. He states his energy level is not back to baseline  Review of Systems     Objective:   Physical Exam BP 124/82  Pulse 68  Temp(Src) 98 F (36.7 C) (Temporal)  Resp 18  Ht 6\' 2"  (1.88 m)  Wt 212 lb (96.163 kg)  BMI 27.21 kg/m2 Alert, no apparent distress Abdomen-soft, nontender, nondistended. Essentially healed midline incision with staples. No cellulitis or induration. No evidence of incisional hernia. 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 We removed his surgical skin staples today and placed Steri-Strips. He was reminded that he should not do any heavy lifting for another 4 weeks. I advised him and his wife  that it is not uncommon to have a low energy level for several weeks. He was encouraged to be active. Followup 8 weeks  Mary Sella. Andrey Campanile, MD, FACS General, Bariatric, & Minimally Invasive Surgery [facility], [place]

## 2013-03-21 NOTE — Patient Instructions (Signed)
Avoid lifting, pushing, or pulling anything greater than 10 pounds for another 4 weeks from now Steri-strips will fall off over the 1-2 weeks

## 2013-04-08 ENCOUNTER — Inpatient Hospital Stay (HOSPITAL_COMMUNITY)
Admission: AD | Admit: 2013-04-08 | Discharge: 2013-04-10 | DRG: 176 | Disposition: A | Discharge status: Home / Self Care | Payer: Medicare Other | Source: Ambulatory Visit | Attending: Internal Medicine | Admitting: Internal Medicine

## 2013-04-08 ENCOUNTER — Other Ambulatory Visit: Payer: Self-pay | Admitting: Internal Medicine

## 2013-04-08 ENCOUNTER — Ambulatory Visit
Admission: RE | Admit: 2013-04-08 | Discharge: 2013-04-08 | Disposition: A | Discharge status: Home / Self Care | Payer: Medicare Other | Source: Ambulatory Visit | Attending: Internal Medicine | Admitting: Internal Medicine

## 2013-04-08 DIAGNOSIS — Z86718 Personal history of other venous thrombosis and embolism: Secondary | ICD-10-CM

## 2013-04-08 DIAGNOSIS — I2699 Other pulmonary embolism without acute cor pulmonale: Principal | ICD-10-CM | POA: Diagnosis present

## 2013-04-08 DIAGNOSIS — E46 Unspecified protein-calorie malnutrition: Secondary | ICD-10-CM | POA: Diagnosis present

## 2013-04-08 DIAGNOSIS — R630 Anorexia: Secondary | ICD-10-CM | POA: Diagnosis present

## 2013-04-08 DIAGNOSIS — R0602 Shortness of breath: Secondary | ICD-10-CM

## 2013-04-08 DIAGNOSIS — R000 Tachycardia, unspecified: Secondary | ICD-10-CM | POA: Diagnosis present

## 2013-04-08 DIAGNOSIS — I10 Essential (primary) hypertension: Secondary | ICD-10-CM | POA: Diagnosis present

## 2013-04-08 DIAGNOSIS — R0789 Other chest pain: Secondary | ICD-10-CM | POA: Diagnosis present

## 2013-04-08 DIAGNOSIS — E785 Hyperlipidemia, unspecified: Secondary | ICD-10-CM | POA: Diagnosis present

## 2013-04-08 DIAGNOSIS — R634 Abnormal weight loss: Secondary | ICD-10-CM | POA: Diagnosis present

## 2013-04-08 DIAGNOSIS — R5381 Other malaise: Secondary | ICD-10-CM | POA: Diagnosis present

## 2013-04-08 DIAGNOSIS — N400 Enlarged prostate without lower urinary tract symptoms: Secondary | ICD-10-CM | POA: Diagnosis present

## 2013-04-08 HISTORY — DX: Other pulmonary embolism without acute cor pulmonale: I26.99

## 2013-04-08 MED ORDER — ALUM & MAG HYDROXIDE-SIMETH 200-200-20 MG/5ML PO SUSP: 30.0000 mL | Freq: Four times a day (QID) | ORAL | Status: DC | PRN

## 2013-04-08 MED ORDER — HEPARIN BOLUS VIA INFUSION
4000.0000 [IU] | Freq: Once | INTRAVENOUS | Status: AC
  Administered 2013-04-08: 4000 [IU] via INTRAVENOUS
  Filled 2013-04-08: qty 4000

## 2013-04-08 MED ORDER — ACETAMINOPHEN 650 MG RE SUPP: 650.0000 mg | Freq: Four times a day (QID) | RECTAL | Status: DC | PRN

## 2013-04-08 MED ORDER — HYDROCODONE-ACETAMINOPHEN 5-325 MG PO TABS
2.0000 | ORAL_TABLET | ORAL | Status: DC | PRN
  Administered 2013-04-09: 2 via ORAL
  Filled 2013-04-08: qty 2

## 2013-04-08 MED ORDER — SODIUM CHLORIDE 0.9 % IJ SOLN: 3.0000 mL | Freq: Two times a day (BID) | INTRAMUSCULAR | Status: DC

## 2013-04-08 MED ORDER — VITAMIN C 500 MG PO TABS
500.0000 mg | ORAL_TABLET | Freq: Every day | ORAL | Status: DC | PRN
  Filled 2013-04-08: qty 1

## 2013-04-08 MED ORDER — HEPARIN (PORCINE) IN NACL 100-0.45 UNIT/ML-% IJ SOLN
1650.0000 [IU]/h | INTRAMUSCULAR | Status: DC
  Administered 2013-04-08: 1400 [IU]/h via INTRAVENOUS
  Filled 2013-04-08 (×4): qty 250

## 2013-04-08 MED ORDER — SIMVASTATIN 40 MG PO TABS
40.0000 mg | ORAL_TABLET | Freq: Every day | ORAL | Status: DC
  Administered 2013-04-08 – 2013-04-09 (×2): 40 mg via ORAL
  Filled 2013-04-08 (×3): qty 1

## 2013-04-08 MED ORDER — ADULT MULTIVITAMIN W/MINERALS CH
1.0000 | ORAL_TABLET | Freq: Every day | ORAL | Status: DC
  Administered 2013-04-08 – 2013-04-10 (×3): 1 via ORAL
  Filled 2013-04-08 (×4): qty 1

## 2013-04-08 MED ORDER — SODIUM CHLORIDE 0.9 % IV SOLN
INTRAVENOUS | Status: DC
  Administered 2013-04-08: 50 mL/h via INTRAVENOUS
  Administered 2013-04-09: 18:00:00 via INTRAVENOUS

## 2013-04-08 MED ORDER — ONDANSETRON HCL 4 MG/2ML IJ SOLN: 4.0000 mg | Freq: Four times a day (QID) | INTRAMUSCULAR | Status: DC | PRN

## 2013-04-08 MED ORDER — VITAMIN D3 25 MCG (1000 UNIT) PO TABS
1000.0000 [IU] | ORAL_TABLET | Freq: Every day | ORAL | Status: DC
  Administered 2013-04-08 – 2013-04-10 (×3): 1000 [IU] via ORAL
  Filled 2013-04-08 (×4): qty 1

## 2013-04-08 MED ORDER — ACETAMINOPHEN 325 MG PO TABS: 650.0000 mg | ORAL_TABLET | Freq: Four times a day (QID) | ORAL | Status: DC | PRN

## 2013-04-08 MED ORDER — IOHEXOL 350 MG/ML SOLN
125.0000 mL | Freq: Once | INTRAVENOUS | Status: AC | PRN
  Administered 2013-04-08: 125 mL via INTRAVENOUS

## 2013-04-08 MED ORDER — ONDANSETRON HCL 4 MG PO TABS: 4.0000 mg | ORAL_TABLET | Freq: Four times a day (QID) | ORAL | Status: DC | PRN

## 2013-04-08 NOTE — H&P (Signed)
Vital Signs  Entered weight:  204  lbs., Calculated Weight: 204 lbs., ( 92.53 kg) Height: 72 in., ( 182.88 cm) Temperature: 97.7 deg F, Temperature site: oral Pulse rate: 117 Pulse rhythm: regular  Blood Pressure #1: 101 / 74 mm Hg    BMI: 27.67 BSA: 2.15 Wt Chg: -7.38 lbs since 03/18/2013  Vitals entered by: Leeann Must  CNA on April 08, 2013 4:05 PM  Pulse Oximetry  O2 Saturation: 93 %  Other Procedures Completed: CXR       History of Present Illness  History from: patient Reason for visit: See chief complaint Chief Complaint: pt had surgery about a month ago and this past week he has had a bad cough and is dyspnea, pt also is losing weight  and seems weak today  History of Present Illness: Has not felt well since discharge.  Poor appetite, lacks energy.  Weight loss 20 lbs since prior to surgery.  Drinking Ensure occasionally.    Shortness of breath- worse with minimal activity.  Gets short of breath walking to exam room.   No swelling in legs.  Limited activity- moving from chair to chair.  Limited walking.  Felt like he pulled a muscle in right chest 2 days ago and then resolved.  No other chest pain.      Review of Systems  General:       Complains of fatigue, weight loss.   Cardiovascular:       Complains of see HPI.   Respiratory:       Complains of see HPI.   Gastrointestinal:       Denies diarrhea, constipation, heartburn.   Genitourinary:       Denies urinary frequency.   Skin:       Denies rash.   Heme/Lymphatic:       Denies bleeding.    Past History Past Medical History (reviewed - no changes required): HTN BPH S/P TURP (4/05) Hyperlipidemia ED Colon polyp-hyperplastic (7/06) Prostate cancer-XRT (Dx 2009-XRT completed 1/10) (Gleason 4+3=7) LLE DVT- (8/10)-->Coumadin X 6 months, ?related to CA treatment or injury to leg; negative hypercoagulable panel Small bowel obstruction secondary to Incarcerated incisional hernia status post repair  (5/14) Surgical History (reviewed - no changes required): APPY (1980s) S/P testicle lowering (1940) S/P T&A  TURP Incarcerated incisional hernia status post repair (5/14) Family History (reviewed - no changes required): Father: CVA (39s), Alzheimers, pneumonia Mother: died 30; MI MGF-?Melanoma MGM-old age PGM-bowel obstruction PGF-old age? Sister-adopted, deceased Son-healthy, thyroid Son-healthy Social History (reviewed - no changes required): Married with 2 sons ([place] and [place]), no grandchildren Education: college Occupation: chemist-retired No Tobacco (college) No Alcohol. No Drugs. Enjoys golf   Physical Exam  General appearance: appears fatigued, mild dyspnea with ambulation  Eyes  External: conjunctivae and lids normal; no scleral icterus Pupils: equal, round, reactive to light and accommodation  Ears, Nose and Throat  External ears: normal, no lesions or deformities External nose: normal, no lesions or deformities Otoscopic: canals clear, tympanic membranes intact, no fluid Nasal: mucosa, septum, and turbinates normal Pharynx: tongue normal, protrudes mid line,  posterior pharynx without erythema or exudate  Neck  Neck: supple, no masses, trachea midline Thyroid: no nodules, masses, tenderness, or enlargement  Respiratory  Respiratory effort: no intercostal retractions or use of accessory muscles Auscultation: no rales, rhonchi, or wheezes  Cardiovascular  Auscultation: S1, S2, no murmur, rub, or gallop Carotid arteries: pulses 2+, symmetric, no bruits Pedal pulses: pulses 2+, symmetric Periph. circulation: no cyanosis, clubbing;  left leg>right leg edema (chronic)  Gastrointestinal  Abdomen: incision clean, dry, no drainage Liver and spleen: no enlargement or nodularity  Lymphatic  Neck: no cervical adenopathy  Musculoskeletal  Gait and station: wheelchair Digits and nails: no clubbing, cyanosis, petechiae, or nodes Head and neck: normal alignment and  mobility Spine, ribs, pelvis: normal alignment and mobility, no deformity RUE: normal ROM and strength, no joint enlargement or tenderness LUE: normal ROM and strength, no joint enlargement or tenderness RLE: normal ROM and strength, no joint enlargement or tenderness LLE: normal ROM and strength, no joint enlargement or tenderness  Mental Status Exam  Judgment, insight: intact Mood and affect: Normal Mood   Impression & Recommendations:  Problem # 1:  Dyspnea (ICD-786.05) (ICD10-R06.00) Increased dyspnea with borderline low oxygen saturation 93% with ambulation concerning for postoperative PE.  He did receive DVT prophylaxis during his hospitalization; however, with a prior history of DVT he is at high risk with his sedentary lifestyle.  Will proceed with CT angiogram this evening to evaluate.  If it is positive for a small to moderate clot burden we'll treat as an outpatient with Xarelto.  If large clot burden he'll need admission for anticoagulation and monitoring.  I gave him samples of Xarelto to take this evening if positive.   Orders: CT Chest W/ Contrast (CTCW) Chest X-Ray (Pa & L) (CPT-71020)   Problem # 2:  Atypical chest pain (MVH-846.96) (ICD10-R07.89) He is describing some right shoulder pain which he feels is a muscle strain.  Rule out PE with CT angiogram. Orders: EKG 12 Leads (CPT-93000)   Problem # 3:  Tachycardia (ICD-785.0) (ICD10-R00.0) EKG shows sinus tachycardia with nonspecific changes.   Orders: EKG 12 Leads (CPT-93000)   Problem # 4:  Weight loss (ICD-783.21) (ICD10-R63.4) He has continued to lose weight after his recent hospitalization.  Encouraged Ensure/boost supplements to increase calorie intake.  Complete Medication List: 1)  Eql Vitamin C 500 Mg Tabs (Ascorbic acid) .... One po every day 2)  Vitamin D 1000 Unit Tabs (Cholecalciferol) .... One po every day 3)  Benicar Hct 20-12.5 Mg Tabs (Olmesartan medoxomil-hctz) .Marland Kitchen.. 1 tab daily 4)   Multivitamins Tabs (Multiple vitamin) .... Take one tablet by mouth every day 5)  Simvastatin 40 Mg Tabs (Simvastatin) .Marland Kitchen.. 1 tab qhs 6)  Cialis 20 Mg Tabs (Tadalafil) .... 1/2- 1 po daily prn  Other Orders: Venipuncture ([id number]) CBC/Platelets ([id number]) PT/INR (25366) BMET (SMA 7) ([id number])   Patient Goals/Instructions: If CT shows a small blood clot- start Xeralto 15mg  twice a day and we will follow-up on long-term treatment options.  If large blood clot will need admission.  If no blood clot, increase calorie intake with Ensure or Boost shakes twice a day.   ]    Electronically signed by Donnie Mesa MD on 04/08/2013 at 5:48 PM    Addendum: CT shows bilateral Bilateral pulmonary emboli are present. Moderate clot  burden involving the upper and lower lobe pulmonary arteries  Bilaterally.  Given the extent of clot burden will admit to telemetry bed for hemodynamic monitoring and initiation of anticoagulation with Heparin drip.  Will transition to [facility] if stable.  Anticipate need for lifelong anticoagulation given recurrent clotting issue (both episodes post-operatively).  Labs obtained in the office today:   BASIC METABOLIC PANEL (1000)   GLUCOSE              [H]  119 mg/dl  60-110   BUN                       18 mg/dl                    4-78   CREATININE                0.8 mg/dl                   2.9-5.6  eGFR Non-African American                             92.1  eGFR African American                             111.4   SODIUM               [L]  131 mEq/L                   135-148   POTASSIUM                 4.1 mEq/L                   3.5-5.3   CHLORIDE                  103 mEq/L                   80-111   CO2                       22 mEq/L                    15-35   CALCIUM                   9.9 mg/dL                   2.1-30.8  Tests: (2) CBC (2000)   WBC                  [H]  13.10 K/uL                  4.10-10.90   LYM                        3.1 K/uL                    0.6-4.1 ! MID                       1.2 K/uL                    0.0-1.8   GRAN                 [H]  8.8 K/uL                    2.0-7.8   LYM%                      23.6 %                      10.0-58.5 ! MID%  9.2 %                       0.1-24.0   GRAN%                     67.2                        37.0-92.0   RBC                       5.1 M/uL                    4.2-6.3   HGB                       14.5 g/dL                   16.1-09.6   HCT                       44.4 %                      37.0-51.0   MCV                       86.8 fL                     80.0-97.0   MCH                       28.3 pg                     26.0-32.0   MCHC                      32.7 g/dL                   04.5-40.9   PLT                  [H]  532 K/uL                    140-440  Tests: (3) PT/INR (8201) ! PT/INR               [L]  1.2                         2.0-4.0

## 2013-04-08 NOTE — Progress Notes (Signed)
Report given to Selena Batten, Charity fundraiser. Will transport pt

## 2013-04-08 NOTE — Progress Notes (Signed)
Pt arrived to floor via wheelchair as a direct admit. Pt alert and oriented, but hard of hearing. Attempted to look for IV access, but will have a 2nd RN look. Pt and wife oriented to room and call bell and fall prevention measures.

## 2013-04-08 NOTE — Progress Notes (Addendum)
ANTICOAGULATION CONSULT NOTE - Initial Consult  Pharmacy Consult for heparin Indication: pulmonary embolus  No Known Allergies  Patient Measurements: Height: 6\' 2"  (188 cm) Weight: 200 lb 14.4 oz (91.128 kg) IBW/kg (Calculated) : 82.2 Heparin Dosing Weight: 91.1 kg  Vital Signs: Temp: 98.1 F (36.7 C) (06/23 1917) Temp src: Oral (06/23 1917) BP: 130/83 mmHg (06/23 1917) Pulse Rate: 108 (06/23 1917)  Labs: No results found for this basename: HGB, HCT, PLT, APTT, LABPROT, INR, HEPARINUNFRC, CREATININE, CKTOTAL, CKMB, TROPONINI,  in the last 72 hours  Estimated Creatinine Clearance: 75.2 ml/min (by C-G formula based on Cr of 0.85).   Medical History: Past Medical History  Diagnosis Date  . Hypertension   . Prostate cancer     "no surgery; radiation" (03/05/2013)  . DVT (deep venous thrombosis)     LLE/notes 03/04/2013 (03/05/2013)  . Hyperlipidemia     Medications:  Scheduled:  . cholecalciferol  1,000 Units Oral Daily  . multivitamin with minerals  1 tablet Oral Daily  . simvastatin  40 mg Oral QHS  . sodium chloride  3 mL Intravenous Q12H   Infusions:  . sodium chloride      Assessment: 79 yo male with moderately severe bilateral PE on CT angio will be started on heparin therapy.  Today's lab: INR 1.2; SCr 0.8; Hgb 14.5 and Plt 532 K.  Per patient, he didn't take any sample of the Xarelto today; the sample was given by his doctor today.  Goal of Therapy:  Heparin level 0.3-0.7 units/ml Monitor platelets by anticoagulation protocol: Yes   Plan:  1) Heparin 4000 units iv bolus x1, then start heparin drip at 1400 units/hr 2) Check an 8 hour heparin level after drip is started 3) Daily heparin level and CBC 4) f/u starting oral anticoagulant.  Reniya Mcclees, Tsz-Yin 04/08/2013,7:18 PM

## 2013-04-09 ENCOUNTER — Encounter (HOSPITAL_COMMUNITY): Payer: Self-pay | Admitting: General Practice

## 2013-04-09 DIAGNOSIS — I2699 Other pulmonary embolism without acute cor pulmonale: Secondary | ICD-10-CM

## 2013-04-09 HISTORY — DX: Other pulmonary embolism without acute cor pulmonale: I26.99

## 2013-04-09 LAB — BASIC METABOLIC PANEL
BUN: 15 mg/dL (ref 6–23)
Calcium: 9.6 mg/dL (ref 8.4–10.5)
Chloride: 97 mEq/L (ref 96–112)
Creatinine, Ser: 0.84 mg/dL (ref 0.50–1.35)
GFR calc Af Amer: >90 mL/min (ref >90)

## 2013-04-09 LAB — HEPARIN LEVEL (UNFRACTIONATED): Heparin Unfractionated: 0.26 IU/mL — ABNORMAL LOW (ref 0.30–0.70)

## 2013-04-09 LAB — CBC
Hemoglobin: 13.4 g/dL (ref 13.0–17.0)
MCH: 27.2 pg (ref 26.0–34.0)
MCHC: 33.8 g/dL (ref 30.0–36.0)
RDW: 13.4 % (ref 11.5–15.5)

## 2013-04-09 MED ORDER — RIVAROXABAN 15 MG PO TABS
15.0000 mg | ORAL_TABLET | Freq: Two times a day (BID) | ORAL | Status: DC
  Administered 2013-04-10: 15 mg via ORAL
  Filled 2013-04-09 (×3): qty 1

## 2013-04-09 MED ORDER — HEPARIN (PORCINE) IN NACL 100-0.45 UNIT/ML-% IJ SOLN
1850.0000 [IU]/h | INTRAMUSCULAR | Status: AC
  Administered 2013-04-09: 1850 [IU]/h via INTRAVENOUS
  Administered 2013-04-09 (×2): 1650 [IU]/h via INTRAVENOUS
  Administered 2013-04-10: 1850 [IU]/h via INTRAVENOUS
  Filled 2013-04-09 (×3): qty 250

## 2013-04-09 MED ORDER — RIVAROXABAN 20 MG PO TABS: 20.0000 mg | ORAL_TABLET | Freq: Every day | ORAL | Status: DC

## 2013-04-09 MED ORDER — RIVAROXABAN 15 MG PO TABS
15.0000 mg | ORAL_TABLET | Freq: Two times a day (BID) | ORAL | Status: DC
  Filled 2013-04-09 (×2): qty 1

## 2013-04-09 MED ORDER — HEPARIN BOLUS VIA INFUSION
1000.0000 [IU] | Freq: Once | INTRAVENOUS | Status: AC
  Administered 2013-04-09: 1000 [IU] via INTRAVENOUS
  Filled 2013-04-09: qty 1000

## 2013-04-09 MED ORDER — HEPARIN BOLUS VIA INFUSION
3000.0000 [IU] | Freq: Once | INTRAVENOUS | Status: AC
  Administered 2013-04-09: 3000 [IU] via INTRAVENOUS
  Filled 2013-04-09: qty 3000

## 2013-04-09 MED ORDER — ENSURE COMPLETE PO LIQD
237.0000 mL | Freq: Two times a day (BID) | ORAL | Status: DC
  Administered 2013-04-09 – 2013-04-10 (×3): 237 mL via ORAL

## 2013-04-09 NOTE — Progress Notes (Signed)
IV Heparin Discontinued per order. Will continue to monitor.

## 2013-04-09 NOTE — Evaluation (Signed)
Physical Therapy Evaluation Patient Details Name: Alexander Cole MRN: [id number] DOB: November 26, 1925 Today's Date: 04/09/2013 Time: 4098-1191 PT Time Calculation (min): 20 min  PT Assessment / Plan / Recommendation Clinical Impression  Pt is an 79 y.o. male with bilateral PEs currently admitted for anticoagulation. Patient demonstrates some dyspnea upon exertion with O2 saturation falling to 84% after ambulating and performing stairs. Pt educated on pursed lip breathing and rest breaks taken, SpO2 rebounded well. Was 96% on rm air after rest at conclusion of session. Feel patient does not require acute PT services at this time. PT will sign off. Pt encouraged to continue ambulation with staff prior to discharge home. If changes in medical status occur please reconsult.    PT Assessment  Patent does not need any further PT services    Follow Up Recommendations  No PT follow up          Equipment Recommendations  None recommended by PT          Precautions / Restrictions     Pertinent Vitals/Pain SpO2 at rest on rm air 96%; with ambulaton 83%      Mobility  Bed Mobility Bed Mobility: Supine to Sit;Sitting - Scoot to Edge of Bed Supine to Sit: 7: Independent Sitting - Scoot to [place] of Bed: 7: Independent Transfers Transfers: Sit to Stand;Stand to Sit Sit to Stand: 7: Independent Stand to Sit: 7: Independent Ambulation/Gait Ambulation/Gait Assistance: 5: Supervision Ambulation Distance (Feet): 400 Feet Assistive device: None Ambulation/Gait Assistance Details: Some modest instability but patient able to maintain balance without difficulty Gait Pattern: Within Functional Limits Stairs: Yes Stairs Assistance: 5: Supervision Stair Management Technique: One rail Right;Alternating pattern;Forwards Number of Stairs: 5       Visit Information  Last PT Received On: 04/09/13 Assistance Needed: +1    Subjective Data  Subjective: I feel a little weak Patient Stated Goal:  to go home   Prior Functioning  Home Living Lives With: Spouse Available Help at Discharge: Family;Available 24 hours/day Type of Home: House Home Access: Stairs to enter [employer] of Steps: 3 Entrance Stairs-Rails: None Home Layout: Multi-level;Bed/bath upstairs Alternate Level Stairs-Number of Steps: 7 Alternate Level Stairs-Rails: Right Bathroom Shower/Tub: Tub/shower unit;Curtain Firefighter: Standard Home Adaptive Equipment: Paediatric nurse with back;Straight cane Prior Function Level of Independence: Independent Able to Take Stairs?: Yes Driving: Yes Vocation: Retired Musician:  (wears trifocal glasses) Dominant Hand: Right    Cognition  Cognition Arousal/Alertness: Awake/alert Behavior During Therapy: WFL for tasks assessed/performed Overall Cognitive Status: Within Functional Limits for tasks assessed    Extremity/Trunk Assessment Right Upper Extremity Assessment RUE ROM/Strength/Tone: [facility] for tasks assessed Left Upper Extremity Assessment LUE ROM/Strength/Tone: WFL for tasks assessed Right Lower Extremity Assessment RLE ROM/Strength/Tone: [facility] for tasks assessed Left Lower Extremity Assessment LLE ROM/Strength/Tone: WFL for tasks assessed   Balance Balance Balance Assessed: Yes High Level Balance High Level Balance Activites: Side stepping;Backward walking;Direction changes;Turns High Level Balance Comments: no LOB  End of Session PT - End of Session Equipment Utilized During Treatment: Gait belt Activity Tolerance: Patient tolerated treatment well Patient left: in chair;with call bell/phone within reach Nurse Communication: Mobility status  GP     Fabio Asa 04/09/2013, 10:21 AM Charlotte Crumb, PT DPT  [phone]

## 2013-04-09 NOTE — Progress Notes (Signed)
Subjective: No complaints.  Rested well.  No chest pain or increase in shortness of breath.  Still feels weak.    Objective: Vital signs in last 24 hours: Temp:  [97.6 F (36.4 C)-98.1 F (36.7 C)] 97.6 F (36.4 C) (06/24 0433) Pulse Rate:  [83-108] 83 (06/24 0433) Resp:  [18-19] 18 (06/24 0433) BP: (97-136)/(63-85) 97/63 mmHg (06/24 0433) SpO2:  [92 %-94 %] 92 % (06/24 0433) Weight:  [91.128 kg (200 lb 14.4 oz)] 91.128 kg (200 lb 14.4 oz) (06/23 1917) Weight change:  Last BM Date: 04/08/13  CBG (last 3)  No results found for this basename: GLUCAP,  in the last 72 hours  Intake/Output from previous day: 06/23 0701 - 06/24 0700 In: -  Out: 550 [Urine:550] Intake/Output this shift:    General appearance: alert and no distress Eyes: no scleral icterus Throat: oropharynx moist without erythema Resp: clear to auscultation bilaterally Cardio: regular rate and rhythm GI: soft, non-tender; bowel sounds normal; no masses,  no organomegaly Extremities: no clubbing, cyanosis or edema   Lab Results:  Recent Labs  04/09/13 0445  NA 132*  K 4.2  CL 97  CO2 25  GLUCOSE 123*  BUN 15  CREATININE 0.84  CALCIUM 9.6     Recent Labs  04/09/13 0445  WBC 13.1*  HGB 13.4  HCT 39.6  MCV 80.3  PLT 316    Studies/Results: Ct Angio Chest Pe W/cm &/or Wo Cm  04/08/2013   *RADIOLOGY REPORT*  Clinical Data: Short of breath and cough  CT ANGIOGRAPHY CHEST  Technique:  Multidetector CT imaging of the chest using the standard protocol during bolus administration of intravenous contrast. Multiplanar reconstructed images including MIPs were obtained and reviewed to evaluate the vascular anatomy.  Contrast: OMNIPAQUE IOHEXOL 350 MG/ML SOLN  Comparison: Chest 03/04/2013  Findings: Bilateral pulmonary emboli are present.  Moderate clot burden involving the upper and lower lobe pulmonary arteries bilaterally.  Negative for aortic aneurysm or dissection.  Heart size is normal. No  pericardial effusion.  The lungs are clear.  Negative for infiltrate or effusion.  No mass or adenopathy.  There is mild thickening of the gallbladder wall with pericholecystic fluid.  Question right upper quadrant pain.  Acute cholecystitis not excluded.  4 cm left upper pole renal cyst.  IMPRESSION: Moderately severe bilateral pulmonary emboli.  Pericholecystic fluid is present around the gallbladder.  Question right upper quadrant pain.   Original Report Authenticated By: Janeece Riggers, M.D.     Medications: Scheduled: . cholecalciferol  1,000 Units Oral Daily  . multivitamin with minerals  1 tablet Oral Daily  . simvastatin  40 mg Oral QHS  . sodium chloride  3 mL Intravenous Q12H   Continuous: . sodium chloride 50 mL/hr (04/08/13 2109)  . heparin 1,650 Units/hr (04/09/13 0524)    Assessment/Plan: Principal Problem: 1. Pulmonary embolism, bilateral- recurrent DVT occuring after recent surgery in the setting of being sedentary at home.  Will continue Heparin X 24 more hours and transition to Xarelto.  Discussed Coumadin vs. Xarelto pros/cons and he prefers Xarelto.  Will likely need lifelong anticoagulation given recurrent clots.  Active Problems: 2. Essential hypertension, benign- Benicar HCT on hold due to borderline bp and risk of hypotension. 3. History of DVT of lower extremity- lifelong anticoagulation. 4. Loss of weight- encouraged po intake.  Add Ensure shakes. 5. Disposition- anticipate discharge tomorrow if stable on Xarelto.  PT/OT consult.    LOS: 1 day   Iyonna Rish,W DOUGLAS 04/09/2013, 7:12 AM

## 2013-04-09 NOTE — Progress Notes (Signed)
Paged HCP for medication to pt. With non-productive cough.  Dr. Algis Downs to give pt. Vicodin to help with pain and cough so pt. Can rest.   Thane Edu, RN

## 2013-04-09 NOTE — Care Management Note (Unsigned)
    Page 1 of 1   04/09/2013     4:02:35 PM   CARE MANAGEMENT NOTE 04/09/2013  Patient:  Alexander, Cole   Account Number:  [id number]  Date Initiated:  04/09/2013  Documentation initiated by:  Tomica Arseneault  Subjective/Objective Assessment:   PT ADM ON 04/08/13 WITH BILATERAL PE.  PTA, PT INDEPENDENT, LIVES WITH SPOUSE.     Action/Plan:   PT TO DC HOME ON XARELTO.  FREE 10 DAY TRIAL CARD GIVEN TO PT; WILL NEED SEPARATE RX FOR 10 DAY SUPPLY UPON DC.  WILL CHECK COPAY INFORMATION.   Anticipated DC Date:  04/11/2013   Anticipated DC Plan:  HOME/SELF CARE      DC Planning Services  CM consult  Medication Assistance      Choice offered to / List presented to:             Status of service:  In process, will continue to follow Medicare Important Message given?   (If response is "NO", the following Medicare IM given date fields will be blank) Date Medicare IM given:   Date Additional Medicare IM given:    Discharge Disposition:    Per UR Regulation:  Reviewed for med. necessity/level of care/duration of stay  If discussed at Long Length of Stay Meetings, dates discussed:    Comments:

## 2013-04-09 NOTE — Progress Notes (Deleted)
ANTICOAGULATION CONSULT NOTE - Initial Consult  Pharmacy Consult for xarelto  Indication: pulmonary embolus  No Known Allergies  Patient Measurements: Height: 6\' 2"  (188 cm) Weight: 200 lb 14.4 oz (91.128 kg) IBW/kg (Calculated) : 82.2 Heparin Dosing Weight:   Vital Signs: Temp: 97.6 F (36.4 C) (06/24 0433) Temp src: Oral (06/24 0433) BP: 97/63 mmHg (06/24 0433) Pulse Rate: 83 (06/24 0433)  Labs:  Recent Labs  04/09/13 0445  HGB 13.4  HCT 39.6  PLT 316  HEPARINUNFRC 0.12*  CREATININE 0.84    Estimated Creatinine Clearance: 76.1 ml/min (by C-G formula based on Cr of 0.84).   Medical History: Past Medical History  Diagnosis Date  . Hypertension   . Prostate cancer     "no surgery; radiation" (03/05/2013)  . DVT (deep venous thrombosis)     LLE/notes 03/04/2013 (03/05/2013)  . Hyperlipidemia     Medications:  Pt just received 3000 unit bolus around 0630 with rate increase will adjust with xarelto start time in this 79 yo  Assessment: 79 yo with moderately severe bilateral PE on heparin drip which was subtherapeutic requiring a bolus and rate increase around 0530 this am....currently on 1650 unit/hr no transitioning to xarelto Crcl estimated at 84ml/min Goal of Therapy:   xarelto for PE with consideration of renal fxn   Plan:  DC heparin and begin Xarelto 15mg  bid for 21 days then 20 mg daily thereafter. Monitor for bleeding and renal adjustments with follow up.   Janice Coffin 04/09/2013,7:39 AM

## 2013-04-09 NOTE — Progress Notes (Signed)
ANTICOAGULATION CONSULT NOTE - Follow Up Consult  Pharmacy Consult for Heparin --> Transition to Xarelto on 6/25 Indication: pulmonary embolus  No Known Allergies  Patient Measurements: Height: 6\' 2"  (188 cm) Weight: 200 lb 14.4 oz (91.128 kg) IBW/kg (Calculated) : 82.2 Heparin Dosing Weight: 91.1kg  Vital Signs: Temp: 98.1 F (36.7 C) (06/24 1339) Temp src: Oral (06/24 1339) BP: 100/54 mmHg (06/24 1339) Pulse Rate: 80 (06/24 1339)  Labs:  Recent Labs  04/09/13 0445  HGB 13.4  HCT 39.6  PLT 316  HEPARINUNFRC 0.12*  CREATININE 0.84    Estimated Creatinine Clearance: 76.1 ml/min (by C-G formula based on Cr of 0.84).   Medications:  Heparin 1650 units/hr  Assessment: 86yom on heparin for CTA confirmed bilateral PE. Heparin level (0.26) is subtherapeutic but trended up with increased rate - will plan to increase heparin rate and check follow-up level tonight. Heparin drip was turned off ~30 minutes this AM due to miscommunication of Xarelto orders. Orders were clarified that MD wishes to continue heparin drip through 6/24 and start Xarelto on 6/25 AM. Will plan to discontinue heparin drip tomorrow AM at the time of the first dose of Xarelto.  - H/H and Plts wnl - No significant bleeding reported - CrCl 57ml/min  Goal of Therapy:  Heparin level 0.3-0.7 units/ml Monitor platelets by anticoagulation protocol: Yes   Plan:  1. Heparin IV bolus 1000 units x 1 2. Increase heparin drip to 1850 units/hr (18.5 ml/hr) 3. Check heparin level 8 hours after rate increase. Will not plan to check level in AM as heparin will be stopped 4. Discontinue heparin drip at the time of Xarelto administration tomorrow AM (~0800) 5. Starting 6/25 AM (~0800) - Xarelto 15mg  BIDWC x 21 days, then 20mg  daily with food  6. Check baseline LFTs wnl AM labs  Cleon Dew [phone] 04/09/2013,4:10 PM

## 2013-04-09 NOTE — Progress Notes (Signed)
IV heparin restarted per order. Will continue to monitor.

## 2013-04-09 NOTE — Evaluation (Signed)
Occupational Therapy Evaluation and Discharge Patient Details Name: Alexander Cole MRN: [id number] DOB: November 13, 1925 Today's Date: 04/09/2013 Time: 7829-5621 OT Time Calculation (min): 20 min  OT Assessment / Plan / Recommendation Clinical Impression  This 79 yo male admitted with Bil PEs presents to acute OT with all education completed. Will sign off.    OT Assessment  Patient does not need any further OT services    Follow Up Recommendations  No OT follow up       Equipment Recommendations  None recommended by OT          Precautions / Restrictions Precautions Precautions: None Restrictions Weight Bearing Restrictions: No   Pertinent Vitals/Pain sats dropped to 86% while ambulating; however came up to 96% with minimal pursed lipped breathing once patient sat down.    ADL  Equipment Used: Gait belt Transfers/Ambulation Related to ADLs: S for all and will have this at home ADL Comments: S for all and wil have this at home. Educated pt on purse lipped breathing and doing this every time he transitions from one place to another (ie: going to the bathroom, once he sits then do 5 repetitions of this, going back to chair, 5 reps of this)      Visit Information  Last OT Received On: 04/09/13 Assistance Needed: +1 PT/OT Co-Evaluation/Treatment: Yes    Subjective Data  Patient Stated Goal: Go home today   Prior Functioning     Home Living Lives With: Spouse Available Help at Discharge: Family;Available 24 hours/day Type of Home: House Home Access: Stairs to enter [employer] of Steps: 3 Entrance Stairs-Rails: None Home Layout: Multi-level;Bed/bath upstairs Alternate Level Stairs-Number of Steps: 7 Alternate Level Stairs-Rails: Right Bathroom Shower/Tub: Tub/shower unit;Curtain Firefighter: Standard Home Adaptive Equipment: Paediatric nurse with back;Straight cane Prior Function Level of Independence: Independent Able to Take Stairs?:  Yes Driving: Yes Vocation: Retired Musician:  (wears trifocal glasses) Dominant Hand: Right         Vision/Perception Vision - History Baseline Vision:  (trifocals) Patient Visual Report: No change from baseline   Cognition  Cognition Arousal/Alertness: Awake/alert Behavior During Therapy: WFL for tasks assessed/performed Overall Cognitive Status: Within Functional Limits for tasks assessed    Extremity/Trunk Assessment Right Upper Extremity Assessment RUE ROM/Strength/Tone: [facility] for tasks assessed Left Upper Extremity Assessment LUE ROM/Strength/Tone: WFL for tasks assessed Right Lower Extremity Assessment RLE ROM/Strength/Tone: [facility] for tasks assessed Left Lower Extremity Assessment LLE ROM/Strength/Tone: [facility] for tasks assessed     Mobility Bed Mobility Bed Mobility: Supine to Sit;Sitting - Scoot to Edge of Bed Supine to Sit: 7: Independent Sitting - Scoot to [place] of Bed: 7: Independent Transfers Sit to Stand: 7: Independent Stand to Sit: 7: Independent        Balance Balance Balance Assessed: Yes High Level Balance High Level Balance Activites: Side stepping;Backward walking;Direction changes;Turns High Level Balance Comments: no LOB   End of Session OT - End of Session Equipment Utilized During Treatment: Gait belt Activity Tolerance: Patient tolerated treatment well Patient left: in chair;with call bell/phone within reach       Evette Georges [phone] 04/09/2013, 11:31 AM

## 2013-04-09 NOTE — Clinical Documentation Improvement (Signed)
MALNUTRITION DOCUMENTATION CLARIFICATION  THIS DOCUMENT IS NOT A PERMANENT PART OF THE MEDICAL RECORD  TO RESPOND TO THE THIS QUERY, FOLLOW THE INSTRUCTIONS BELOW:  1. If needed, update documentation for the patient's encounter via the notes activity.  2. Access this query again and click edit on the In [employer].  3. After updating, or not, click F2 to complete all highlighted (required) fields concerning your review. Select "additional documentation in the medical record" OR "no additional documentation provided".  4. Click Sign note button.  5. The deficiency will fall out of your In Basket *Please let us know if you are not able to complete this workflow by phone or e-mail (listed below).  Please update your documentation within the medical record to reflect your response to this query.                                                                                        04/09/13   Dear Dr.Zoya Sprecher / Associates,  In a better effort to capture your patient's severity of illness, reflect appropriate length of stay and utilization of resources, a review of the patient medical record has revealed the following indicators.    Based on your clinical judgment, please clarify and document in a progress note and/or discharge summary the clinical condition associated with the following supporting information:  In responding to this query please exercise your independent judgment.  The fact that a query is asked, does not imply that any particular answer is desired or expected.   Possible Clinical Conditions?  _______Severe Malnutrition    _______Protein Calorie Malnutrition  _______Severe Protein Calorie Malnutrition   _______Other Condition  _______Cannot clinically determine     Risk Factors: Patient losing weight, 20 pounds since prior surgery, is weak, poor appetite noted per 6/23 progress notes. Add Ensure shakes noted per 6/24 progress notes.   Signs & Symptoms: Ht:  6'2"      Wt: 200 lb 14.4 oz  BMI: 25.8    You may use possible, probable, or suspect with inpatient documentation. possible, probable, suspected diagnoses MUST be documented at the time of discharge  Reviewed: additional documentation in the medical record  Thank You,  Marciano Sequin,  Clinical Documentation Specialist:  Phone: [phone]  Health Information Management [place]

## 2013-04-09 NOTE — Progress Notes (Signed)
ANTICOAGULATION CONSULT NOTE - Follow Up Consult  Pharmacy Consult for heparin Indication: pulmonary embolus  No Known Allergies  Patient Measurements: Height: 6\' 2"  (188 cm) Weight: 200 lb 14.4 oz (91.128 kg) IBW/kg (Calculated) : 82.2 Heparin Dosing Weight:   Vital Signs: Temp: 97.6 F (36.4 C) (06/24 0433) Temp src: Oral (06/24 0433) BP: 97/63 mmHg (06/24 0433) Pulse Rate: 83 (06/24 0433)  Labs:  Recent Labs  04/09/13 0445  HGB 13.4  HCT 39.6  PLT 316  HEPARINUNFRC 0.12*    Estimated Creatinine Clearance: 75.2 ml/min (by C-G formula based on Cr of 0.85).   Medications:    Assessment: subtherapeutic heparin level in 79 yo with PE  Goal of Therapy:  Heparin level 0.3-0.7 units/ml    Plan:  rebolus heparin 3000 units and increase to 1650 units/hr with 8 hour F/U  Janice Coffin 04/09/2013,5:15 AM

## 2013-04-10 DIAGNOSIS — E46 Unspecified protein-calorie malnutrition: Secondary | ICD-10-CM | POA: Diagnosis present

## 2013-04-10 LAB — BASIC METABOLIC PANEL
BUN: 14 mg/dL (ref 6–23)
CO2: 25 mEq/L (ref 19–32)
Chloride: 100 mEq/L (ref 96–112)
Creatinine, Ser: 0.85 mg/dL (ref 0.50–1.35)
GFR calc Af Amer: >90 mL/min (ref >90)

## 2013-04-10 LAB — CBC
HCT: 37.4 % — ABNORMAL LOW (ref 39.0–52.0)
MCHC: 33.7 g/dL (ref 30.0–36.0)
MCV: 80.6 fL (ref 78.0–100.0)
RDW: 13.4 % (ref 11.5–15.5)

## 2013-04-10 LAB — HEPATIC FUNCTION PANEL
Albumin: 2.4 g/dL — ABNORMAL LOW (ref 3.5–5.2)
Total Bilirubin: 0.6 mg/dL (ref 0.3–1.2)
Total Protein: 6.1 g/dL (ref 6.0–8.3)

## 2013-04-10 MED ORDER — RIVAROXABAN 15 MG PO TABS: 15.0000 mg | ORAL_TABLET | Freq: Two times a day (BID) | ORAL | Status: DC

## 2013-04-10 MED ORDER — RIVAROXABAN 20 MG PO TABS: 20.0000 mg | ORAL_TABLET | Freq: Every day | ORAL | Status: AC

## 2013-04-10 MED ORDER — ENSURE COMPLETE PO LIQD: 237.0000 mL | Freq: Two times a day (BID) | ORAL | Status: AC

## 2013-04-10 NOTE — Progress Notes (Signed)
Pt up ambulating in hallway at this time with NT; no complaints; will cont. To monitor.

## 2013-04-10 NOTE — Progress Notes (Signed)
D/c instructions given to pt and wife; both verbalized understanding via teachback method; IV's and tele monitor d/c at this time; pt to d/c home with wife; will cont. To monitor.

## 2013-04-10 NOTE — Progress Notes (Signed)
ANTICOAGULATION CONSULT NOTE  Pharmacy Consult for Heparin --> Transition to Xarelto on 6/25 Indication: pulmonary embolus  No Known Allergies  Patient Measurements: Height: 6\' 2"  (188 cm) Weight: 200 lb 14.4 oz (91.128 kg) IBW/kg (Calculated) : 82.2 Heparin Dosing Weight: 91.1kg  Vital Signs: Temp: 99.1 F (37.3 C) (06/24 1958) Temp src: Oral (06/24 1958) BP: 147/85 mmHg (06/24 1958) Pulse Rate: 87 (06/24 1958)  Labs:  Recent Labs  04/09/13 0445 04/09/13 1532 04/10/13 0001  HGB 13.4  --   --   HCT 39.6  --   --   PLT 316  --   --   HEPARINUNFRC 0.12* 0.26* 0.46  CREATININE 0.84  --   --     Estimated Creatinine Clearance: 76.1 ml/min (by C-G formula based on Cr of 0.84).  Assessment: 79 yo male with PE for heparin.   Goal of Therapy:  Heparin level 0.3-0.7 units/ml Monitor platelets by anticoagulation protocol: Yes   Plan:  Continue Heparin at current rate Discontinue heparin in AM once Xarelto started   Pheobe Sandiford, [employer] 04/10/2013,12:35 AM

## 2013-04-10 NOTE — Discharge Summary (Signed)
DISCHARGE SUMMARY  [name]  MR#: [id number]  DOB:[birth year]  Date of Admission: 04/08/2013 Date of Discharge: 04/10/2013  Attending Physician:Braya Habermehl,W DOUGLAS  Patient's FAO:ZHYQ,M DOUGLAS, MD  Consults:  PT/OT  Discharge Diagnoses: Principal Problem:   Pulmonary embolism, bilateral Active Problems:   Essential hypertension, benign   History of DVT of lower extremity   Loss of weight   Unspecified protein-calorie malnutrition  Past Medical History HTN  BPH S/P TURP (4/05)  Hyperlipidemia  ED  Colon polyp-hyperplastic (7/06)  Prostate cancer-XRT (Dx 2009-XRT completed 1/10) (Gleason 4+3=7)  LLE DVT- (8/10)-->Coumadin X 6 months, ?related to CA treatment or injury to leg; negative hypercoagulable panel  Small bowel obstruction secondary to Incarcerated incisional hernia status post repair (5/14)  Past Surgical History APPY (1980s)  S/P testicle lowering (1940)  S/P T&A  TURP  Incarcerated incisional hernia status post repair (5/14)  Discharge Medications:   Medication List    STOP taking these medications       olmesartan-hydrochlorothiazide 20-12.5 MG per tablet  Commonly known as:  BENICAR HCT      TAKE these medications       feeding supplement Liqd  Take 237 mLs by mouth 2 (two) times daily between meals.     multivitamin with minerals Tabs  Take 1 tablet by mouth daily.     Rivaroxaban 15 MG Tabs tablet  Commonly known as:  XARELTO  Take 1 tablet (15 mg total) by mouth 2 (two) times daily with a meal.     Rivaroxaban 20 MG Tabs  Commonly known as:  XARELTO  Take 1 tablet (20 mg total) by mouth daily with supper.  Start taking on:  05/01/2013     simvastatin 40 MG tablet  Commonly known as:  ZOCOR  Take 40 mg by mouth at bedtime.        Hospital Procedures: Ct Angio Chest Pe W/cm &/or Wo Cm  04/08/2013   *RADIOLOGY REPORT*  Clinical Data: Short of breath and cough  CT ANGIOGRAPHY CHEST  Technique:  Multidetector CT imaging of the  chest using the standard protocol during bolus administration of intravenous contrast. Multiplanar reconstructed images including MIPs were obtained and reviewed to evaluate the vascular anatomy.  Contrast: OMNIPAQUE IOHEXOL 350 MG/ML SOLN  Comparison: Chest 03/04/2013  Findings: Bilateral pulmonary emboli are present.  Moderate clot burden involving the upper and lower lobe pulmonary arteries bilaterally.  Negative for aortic aneurysm or dissection.  Heart size is normal. No pericardial effusion.  The lungs are clear.  Negative for infiltrate or effusion.  No mass or adenopathy.  There is mild thickening of the gallbladder wall with pericholecystic fluid.  Question right upper quadrant pain.  Acute cholecystitis not excluded.  4 cm left upper pole renal cyst.  IMPRESSION: Moderately severe bilateral pulmonary emboli.  Pericholecystic fluid is present around the gallbladder.  Question right upper quadrant pain.   Original Report Authenticated By: Janeece Riggers, M.D.    History of Present Illness: Mr. Alexander Cole is an 79 year old white male with a history of hypertension, prior DVT, and recent small bowel obstruction secondary to incarcerated ventral incisional hernia status post open repair and adhesiolysis with small bowel resection (03/05/13) who presented to the office with the complaint of increased shortness of breath and fatigue. The patient stated that he has not felt well over the past several weeks since his surgery. He has lost about 20 pounds. Over the past several days he developed increased shortness of breath which was worse with  minimal activity such as walking to the exam room from the lobby. He did not notice any swelling in his legs. He did feel like he had pulled a muscle in his right upper chest 2 days prior to admission but denied any other chest pain. In the office he was found to be tachycardic with a heart rate of 117, O2 sats 93%. Chest X-ray showed no acute cardiopulmonary disease.  He  was sent for a CT angiogram to evaluate for pulmonary embolism which did show bilateral PE. Given the clot burden he was admitted for further management.  Of note, the patient was treated with DVT prophylaxis throughout his prior hospitalization with prompt initiation postoperatively.   Hospital Course: Patient was admitted to a telemetry bed for close hemodynamic monitoring. He was started on a heparin drip managed by pharmacy protocol. He continued on this until the morning of discharge when he was transitioned to oral Xarelto. He had no evidence of bleeding and his hemoglobin and platelet count remained stable. He was deemed stable for discharge home to continue Xarelto.  Since this is a recurrent issue, he may need lifelong anticoagulation.  He is also found to have significantly low protein levels in the setting of his weight loss consistent with protein calorie malnutrition. I've encouraged him to use Ensure shakes as supplements to his diet.  Day of Discharge Exam BP 110/70  Pulse 79  Temp(Src) 98.7 F (37.1 C) (Oral)  Resp 18  Ht 6\' 2"  (1.88 m)  Wt 91.128 kg (200 lb 14.4 oz)  BMI 25.78 kg/m2  SpO2 91%  Physical Exam: General appearance: alert in no distress Eyes: no scleral icterus Throat: oropharynx moist without erythema Resp: clear to auscultation bilaterally Cardio: regular rate and rhythm GI: soft, non-tender; bowel sounds normal; no masses,  no organomegaly Extremities: no clubbing, cyanosis or edema  Discharge Labs:  Recent Labs  04/09/13 0445 04/10/13 0525  NA 132* 135  K 4.2 4.3  CL 97 100  CO2 25 25  GLUCOSE 123* 103*  BUN 15 14  CREATININE 0.84 0.85  CALCIUM 9.6 9.4    Recent Labs  04/10/13 0525  AST 16  ALT 41  ALKPHOS 174*  BILITOT 0.6  PROT 6.1  ALBUMIN 2.4*    Recent Labs  04/09/13 0445 04/10/13 0525  WBC 13.1* 8.8  HGB 13.4 12.6*  HCT 39.6 37.4*  MCV 80.3 80.6  PLT 316 299    Discharge instructions:     Discharge Orders    Future Appointments Provider Department Dept Phone   05/02/2013 2:00 PM Atilano Ina, MD [facility], [place] [phone]   Future Orders Complete By Expires     Diet general  As directed     Discharge instructions  As directed     Comments:      Call if there are any issues getting Xarelto prescription filled.  Call for increased shortness of breath, chest pain, bleeding, or falls.    Increase activity slowly  As directed       Disposition: to home  Follow-up Appts: Follow-up with Dr. Clelia Croft at [facility] in 1 week.  Condition on Discharge: Stable  Tests Needing Follow-up:  None  Time with discharge activities: 35 minutes  Signed: Daliyah Sramek,W DOUGLAS 04/10/2013, 7:40 AM

## 2013-05-02 ENCOUNTER — Encounter (INDEPENDENT_AMBULATORY_CARE_PROVIDER_SITE_OTHER): Payer: Self-pay | Admitting: General Surgery

## 2013-05-02 ENCOUNTER — Ambulatory Visit (INDEPENDENT_AMBULATORY_CARE_PROVIDER_SITE_OTHER): Payer: Medicare Other | Admitting: General Surgery

## 2013-05-02 VITALS — BP 140/80 | HR 80 | Temp 97.8°F | Resp 15 | Ht 74.0 in | Wt 211.0 lb

## 2013-05-02 DIAGNOSIS — Z09 Encounter for follow-up examination after completed treatment for conditions other than malignant neoplasm: Secondary | ICD-10-CM

## 2013-05-02 NOTE — Progress Notes (Signed)
Subjective:     Patient ID: Alexander Cole, male   DOB: [birth year], 79 y.o.   MRN: [id number]  HPI 79 year old gentleman comes in today for his first postoperative appointment. He underwent urgent open primary repair of a strangulated incisional hernia, lysis of adhesions, and small bowel resection on May 20. The small bowel resection was performed primarily for an incidental small bowel tumor found distal to the site of his obstruction. 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  Unfortunately since his last visit he was readmitted from June 23-25 with a pulmonary embolism. He has been started on oral anticoagulation with Xarelto. He states he is doing better. He still has some occasional shortness of breath. He is accompanied by his wife. He denies any fever, chills, nausea, vomiting, diarrhea constipation. He is taking stool softeners. He did not take any pain medication after discharge. He reports a good appetite. He states his energy level is Slowly improving.   Review of Systems     Objective:   Physical Exam BP 140/80  Pulse 80  Temp(Src) 97.8 F (36.6 C) (Temporal)  Resp 15  Ht 6\' 2"  (1.88 m)  Wt 211 lb (95.709 kg)  BMI 27.08 kg/m2 Alert, no apparent distress Clear to auscultation bilaterally RRR Abdomen-soft, nontender, nondistended. Well-healed midline incision. This cellulitis. No incisional hernia    Assessment:     Status post open primary repair of a strangulated incisional hernia, lysis of adhesions, small bowel resection for a serosal leiomyoma  PE    Plan:     It is unfortunate he developed a PE. I do agree with anticoagulation. I did discuss with him and his wife the importance of addressing his long-term anticoagulation status with his PCP. He appears to be doing well from a GI perspective. I encouraged him to eat well-balanced diet. Followup as needed  Mary Sella. Andrey Campanile, MD, FACS General, Bariatric, & Minimally Invasive Surgery [facility], [place]

## 2013-05-02 NOTE — Patient Instructions (Signed)
Keep walking Eat a high protein diet

## 2013-08-22 ENCOUNTER — Other Ambulatory Visit: Payer: Self-pay

## 2013-11-15 IMAGING — CT CT ANGIO CHEST
2 of 6 series · 19 of 36 positions shown · IV contrast ([ID] OMNI 350)
Comparison: Chest 03/04/2013

CLINICAL DATA: Short of breath and cough

CT ANGIOGRAPHY CHEST
TECHNIQUE: Multidetector CT imaging of the chest using the
standard protocol during bolus administration of intravenous
contrast. Multiplanar reconstructed images including MIPs were
obtained and reviewed to evaluate the vascular anatomy.
Contrast: 125mL OMNIPAQUE IOHEXOL 350 MG/ML SOLN

[Series 6: pe thin 1.25 · axial · 0.70mm/px · z∈[-286,+4]mm · 18 of 326 slices shown]
[im 18/326  lung]
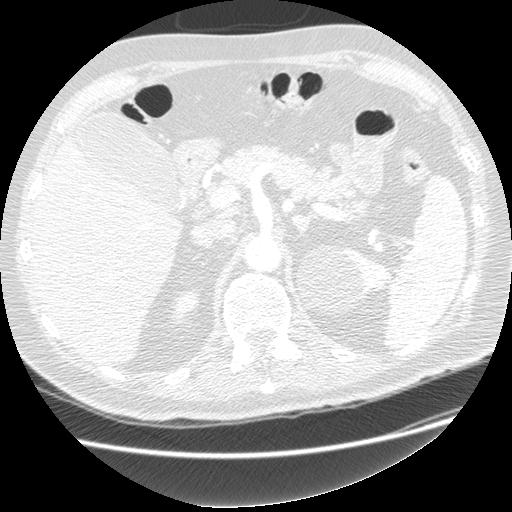
[im 35/326  mediastinal]
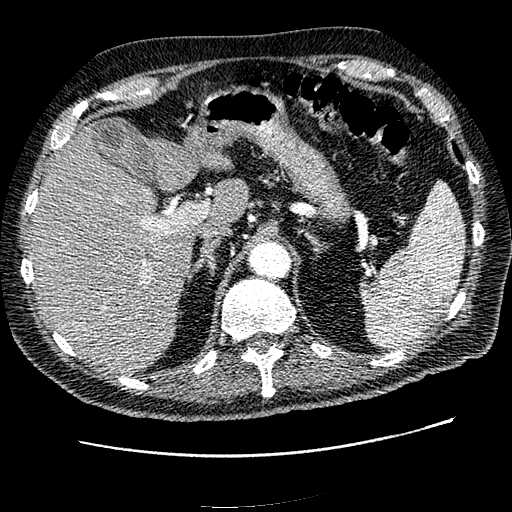
[im 52/326  lung]
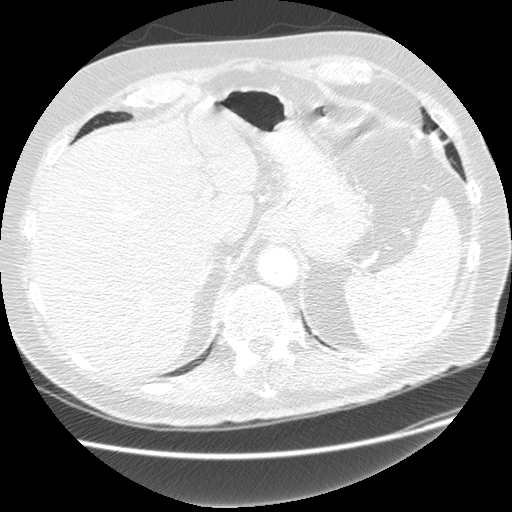
[im 69/326  mediastinal]
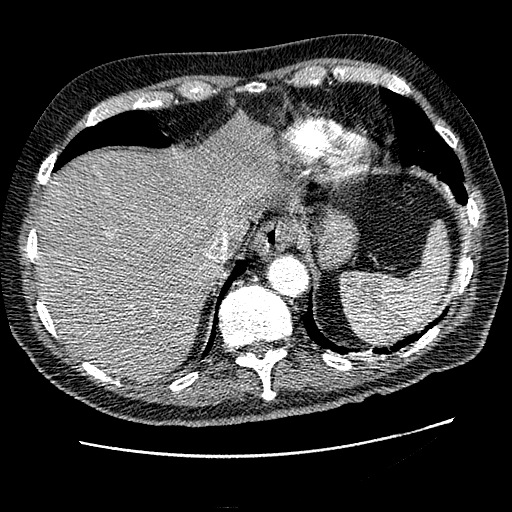
[im 86/326  lung]
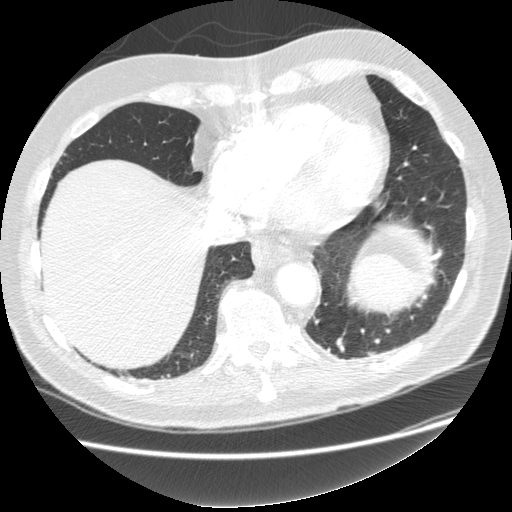
[im 103/326  mediastinal]
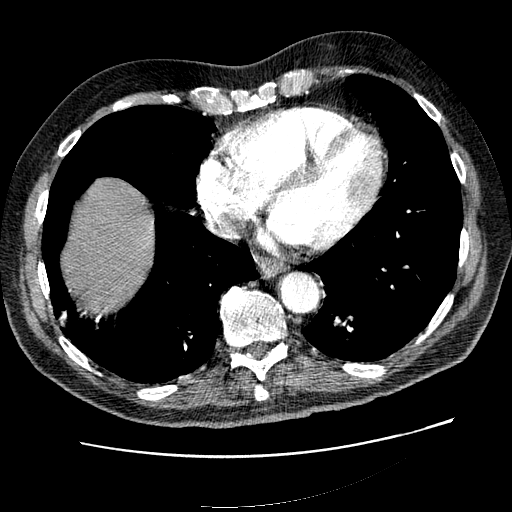
[im 120/326  lung]
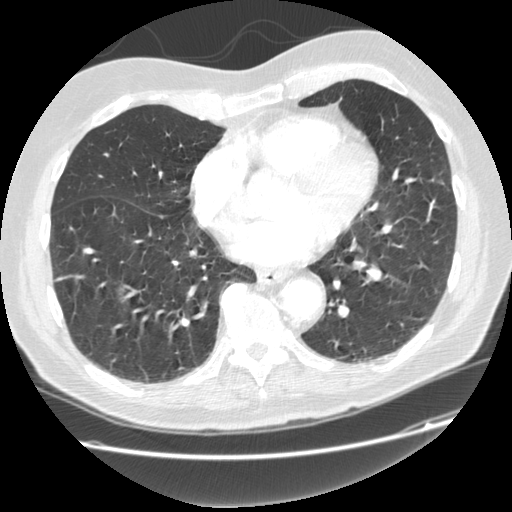
[im 137/326  mediastinal]
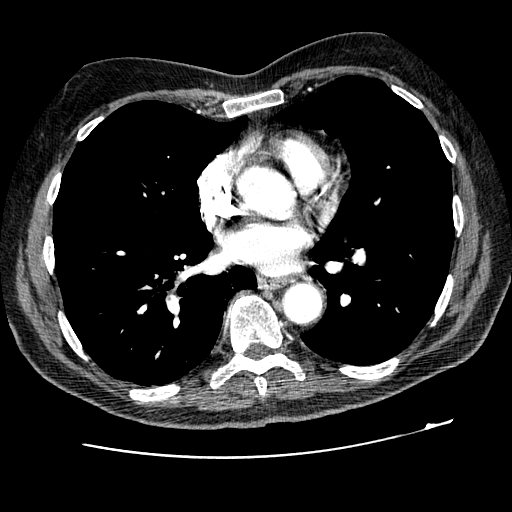
[im 154/326  lung]
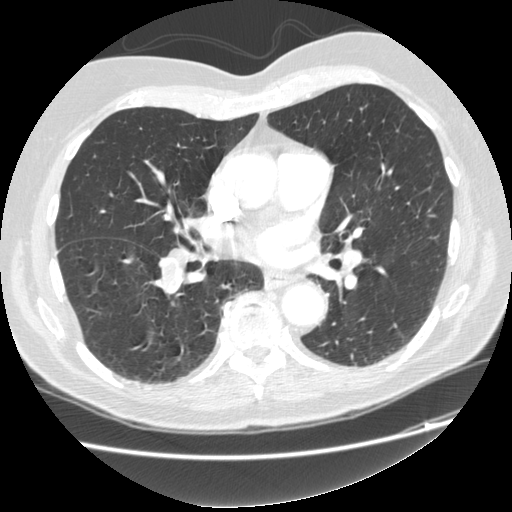
[im 172/326  mediastinal]
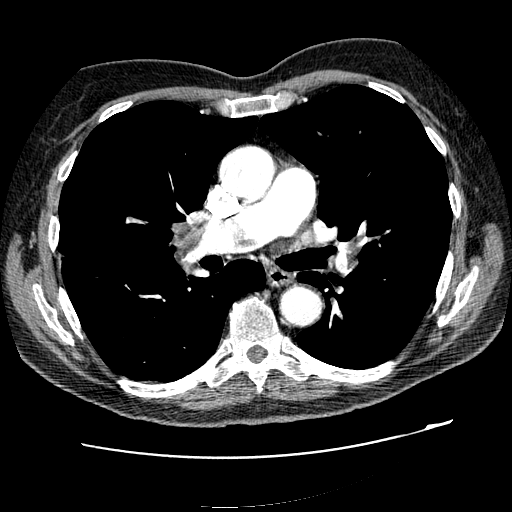
[im 189/326  lung]
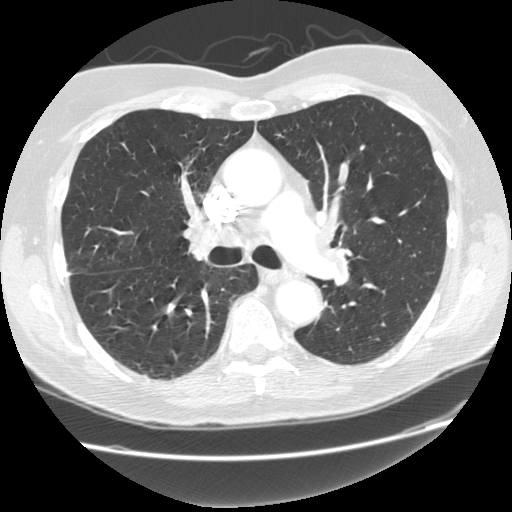
[im 206/326  mediastinal]
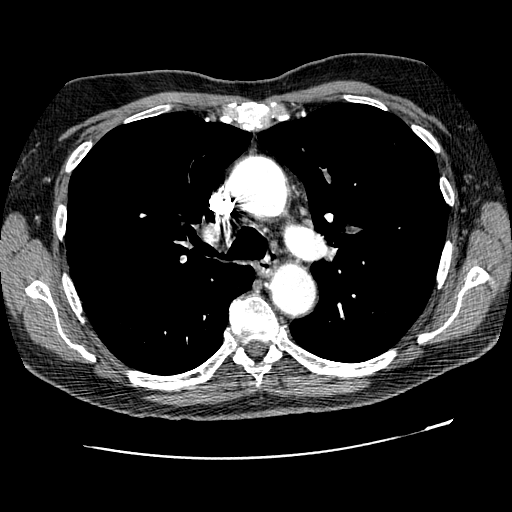
[im 223/326  lung]
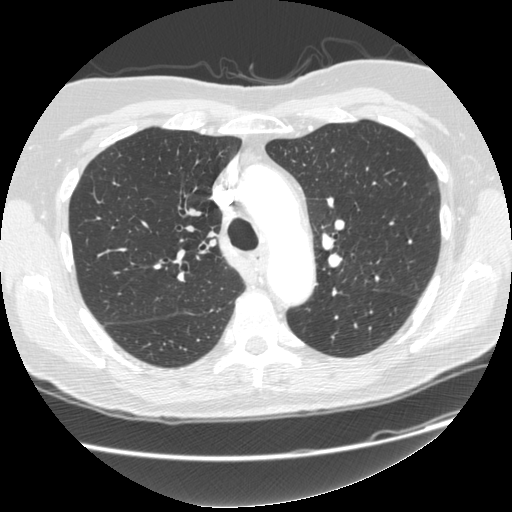
[im 240/326  mediastinal]
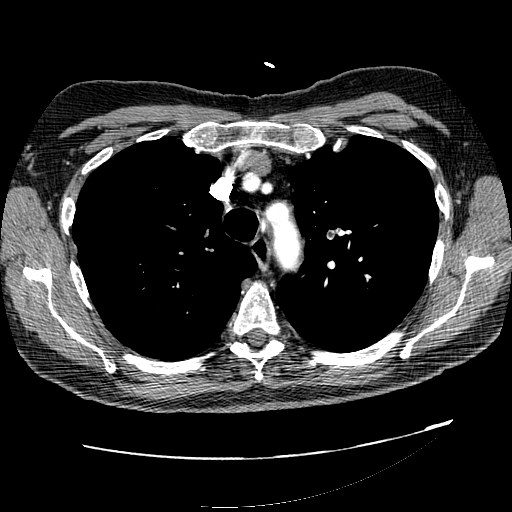
[im 257/326  lung]
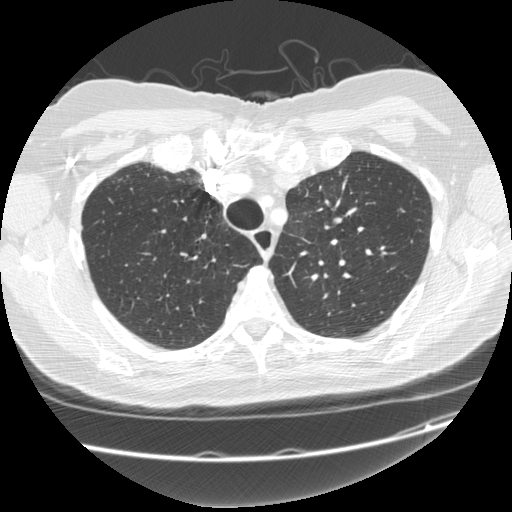
[im 274/326  mediastinal]
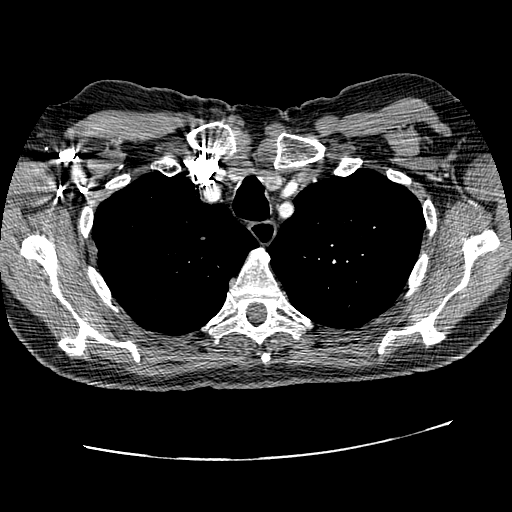
[im 291/326  lung]
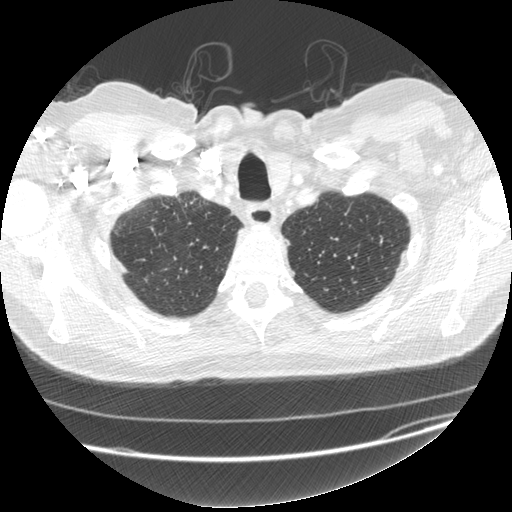
[im 308/326  mediastinal]
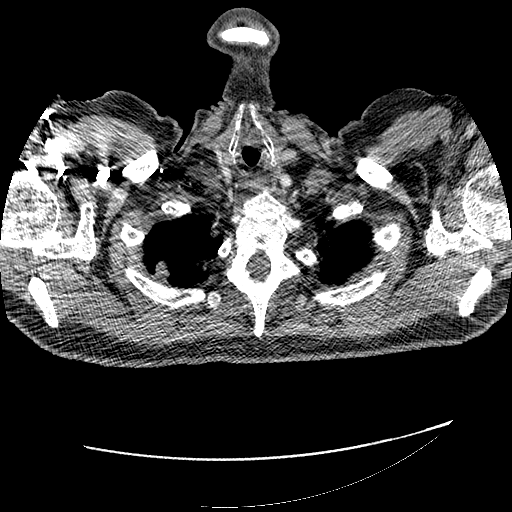

[Series 105: cor · coronal · 0.70mm/px · 1 of 134 slices shown]
[im 67/134  mediastinal]
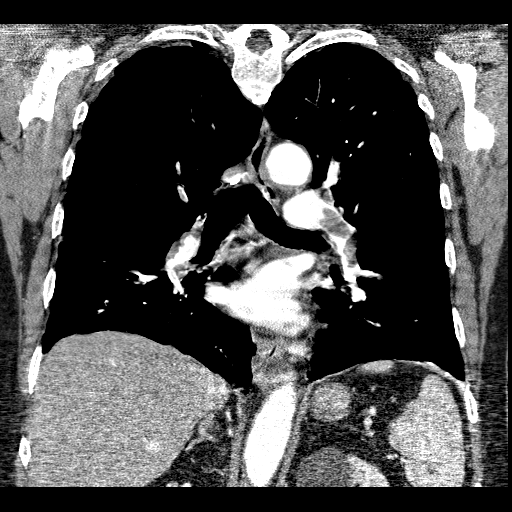

[19 of 36 positions shown; findings below may reference images not displayed]

FINDINGS: Bilateral pulmonary emboli are present.  Moderate clot
burden involving the upper and lower lobe pulmonary arteries
bilaterally.

Negative for aortic aneurysm or dissection.  Heart size is normal.
No pericardial effusion.

The lungs are clear.  Negative for infiltrate or effusion.  No mass
or adenopathy.

There is mild thickening of the gallbladder wall with
pericholecystic fluid.  Question right upper quadrant pain.  Acute
cholecystitis not excluded.  4 cm left upper pole renal cyst.
IMPRESSION: Moderately severe bilateral pulmonary emboli.

Pericholecystic fluid is present around the gallbladder.  Question
right upper quadrant pain.

## 2014-11-04 DIAGNOSIS — N429 Disorder of prostate, unspecified: Secondary | ICD-10-CM | POA: Diagnosis not present

## 2014-11-04 DIAGNOSIS — R972 Elevated prostate specific antigen [PSA]: Secondary | ICD-10-CM | POA: Diagnosis not present

## 2014-11-04 DIAGNOSIS — Z8546 Personal history of malignant neoplasm of prostate: Secondary | ICD-10-CM | POA: Diagnosis not present

## 2014-11-19 DIAGNOSIS — I10 Essential (primary) hypertension: Secondary | ICD-10-CM | POA: Diagnosis not present

## 2014-12-22 DIAGNOSIS — Z6829 Body mass index (BMI) 29.0-29.9, adult: Secondary | ICD-10-CM | POA: Diagnosis not present

## 2014-12-22 DIAGNOSIS — I10 Essential (primary) hypertension: Secondary | ICD-10-CM | POA: Diagnosis not present

## 2015-02-03 DIAGNOSIS — Z8546 Personal history of malignant neoplasm of prostate: Secondary | ICD-10-CM | POA: Diagnosis not present

## 2015-02-03 DIAGNOSIS — R972 Elevated prostate specific antigen [PSA]: Secondary | ICD-10-CM | POA: Diagnosis not present

## 2015-02-03 DIAGNOSIS — N429 Disorder of prostate, unspecified: Secondary | ICD-10-CM | POA: Diagnosis not present

## 2015-02-17 DIAGNOSIS — I10 Essential (primary) hypertension: Secondary | ICD-10-CM | POA: Diagnosis not present

## 2015-02-17 DIAGNOSIS — Z86711 Personal history of pulmonary embolism: Secondary | ICD-10-CM | POA: Diagnosis not present

## 2015-02-17 DIAGNOSIS — E785 Hyperlipidemia, unspecified: Secondary | ICD-10-CM | POA: Diagnosis not present

## 2015-02-17 DIAGNOSIS — Z86718 Personal history of other venous thrombosis and embolism: Secondary | ICD-10-CM | POA: Diagnosis not present

## 2015-02-17 DIAGNOSIS — G3184 Mild cognitive impairment, so stated: Secondary | ICD-10-CM | POA: Diagnosis not present

## 2015-02-17 DIAGNOSIS — Z1389 Encounter for screening for other disorder: Secondary | ICD-10-CM | POA: Diagnosis not present

## 2015-02-17 DIAGNOSIS — Z7901 Long term (current) use of anticoagulants: Secondary | ICD-10-CM | POA: Diagnosis not present

## 2015-05-07 DIAGNOSIS — Z8546 Personal history of malignant neoplasm of prostate: Secondary | ICD-10-CM | POA: Diagnosis not present

## 2015-05-07 DIAGNOSIS — R339 Retention of urine, unspecified: Secondary | ICD-10-CM | POA: Diagnosis not present

## 2015-05-07 DIAGNOSIS — R972 Elevated prostate specific antigen [PSA]: Secondary | ICD-10-CM | POA: Diagnosis not present

## 2015-05-07 DIAGNOSIS — N429 Disorder of prostate, unspecified: Secondary | ICD-10-CM | POA: Diagnosis not present

## 2015-05-12 DIAGNOSIS — I10 Essential (primary) hypertension: Secondary | ICD-10-CM | POA: Diagnosis not present

## 2015-05-12 DIAGNOSIS — Z6829 Body mass index (BMI) 29.0-29.9, adult: Secondary | ICD-10-CM | POA: Diagnosis not present

## 2015-08-25 DIAGNOSIS — Z0000 Encounter for general adult medical examination without abnormal findings: Secondary | ICD-10-CM | POA: Diagnosis not present

## 2015-08-25 DIAGNOSIS — Z125 Encounter for screening for malignant neoplasm of prostate: Secondary | ICD-10-CM | POA: Diagnosis not present

## 2015-08-25 DIAGNOSIS — E785 Hyperlipidemia, unspecified: Secondary | ICD-10-CM | POA: Diagnosis not present

## 2015-08-25 DIAGNOSIS — I10 Essential (primary) hypertension: Secondary | ICD-10-CM | POA: Diagnosis not present

## 2015-11-12 DIAGNOSIS — N281 Cyst of kidney, acquired: Secondary | ICD-10-CM | POA: Diagnosis not present

## 2015-11-12 DIAGNOSIS — N323 Diverticulum of bladder: Secondary | ICD-10-CM | POA: Diagnosis not present

## 2015-11-12 DIAGNOSIS — R319 Hematuria, unspecified: Secondary | ICD-10-CM | POA: Diagnosis not present

## 2015-11-12 DIAGNOSIS — R339 Retention of urine, unspecified: Secondary | ICD-10-CM | POA: Diagnosis not present

## 2015-11-17 DIAGNOSIS — R339 Retention of urine, unspecified: Secondary | ICD-10-CM | POA: Diagnosis not present

## 2015-11-17 DIAGNOSIS — Z8546 Personal history of malignant neoplasm of prostate: Secondary | ICD-10-CM | POA: Diagnosis not present

## 2016-01-20 DIAGNOSIS — L814 Other melanin hyperpigmentation: Secondary | ICD-10-CM | POA: Diagnosis not present

## 2016-01-20 DIAGNOSIS — D2262 Melanocytic nevi of left upper limb, including shoulder: Secondary | ICD-10-CM | POA: Diagnosis not present

## 2016-01-20 DIAGNOSIS — L821 Other seborrheic keratosis: Secondary | ICD-10-CM | POA: Diagnosis not present

## 2016-01-20 DIAGNOSIS — L853 Xerosis cutis: Secondary | ICD-10-CM | POA: Diagnosis not present

## 2016-01-20 DIAGNOSIS — D2261 Melanocytic nevi of right upper limb, including shoulder: Secondary | ICD-10-CM | POA: Diagnosis not present

## 2016-01-20 DIAGNOSIS — L918 Other hypertrophic disorders of the skin: Secondary | ICD-10-CM | POA: Diagnosis not present

## 2016-01-20 DIAGNOSIS — D1801 Hemangioma of skin and subcutaneous tissue: Secondary | ICD-10-CM | POA: Diagnosis not present

## 2016-01-20 DIAGNOSIS — Z85828 Personal history of other malignant neoplasm of skin: Secondary | ICD-10-CM | POA: Diagnosis not present

## 2016-01-20 DIAGNOSIS — L570 Actinic keratosis: Secondary | ICD-10-CM | POA: Diagnosis not present

## 2016-05-17 DIAGNOSIS — R339 Retention of urine, unspecified: Secondary | ICD-10-CM | POA: Diagnosis not present

## 2016-05-17 DIAGNOSIS — Z8546 Personal history of malignant neoplasm of prostate: Secondary | ICD-10-CM | POA: Diagnosis not present

## 2016-08-03 DIAGNOSIS — H524 Presbyopia: Secondary | ICD-10-CM | POA: Diagnosis not present

## 2016-08-03 DIAGNOSIS — Z0100 Encounter for examination of eyes and vision without abnormal findings: Secondary | ICD-10-CM | POA: Diagnosis not present

## 2016-08-18 DIAGNOSIS — N281 Cyst of kidney, acquired: Secondary | ICD-10-CM | POA: Diagnosis not present

## 2016-08-18 DIAGNOSIS — R339 Retention of urine, unspecified: Secondary | ICD-10-CM | POA: Diagnosis not present

## 2016-08-18 DIAGNOSIS — Z8546 Personal history of malignant neoplasm of prostate: Secondary | ICD-10-CM | POA: Diagnosis not present

## 2016-08-31 DIAGNOSIS — E784 Other hyperlipidemia: Secondary | ICD-10-CM | POA: Diagnosis not present

## 2016-08-31 DIAGNOSIS — I10 Essential (primary) hypertension: Secondary | ICD-10-CM | POA: Diagnosis not present

## 2016-09-06 DIAGNOSIS — E668 Other obesity: Secondary | ICD-10-CM | POA: Diagnosis not present

## 2016-09-06 DIAGNOSIS — I10 Essential (primary) hypertension: Secondary | ICD-10-CM | POA: Diagnosis not present

## 2016-09-06 DIAGNOSIS — F0280 Dementia in other diseases classified elsewhere without behavioral disturbance: Secondary | ICD-10-CM | POA: Diagnosis not present

## 2016-09-06 DIAGNOSIS — Z7901 Long term (current) use of anticoagulants: Secondary | ICD-10-CM | POA: Diagnosis not present

## 2016-09-06 DIAGNOSIS — Z86711 Personal history of pulmonary embolism: Secondary | ICD-10-CM | POA: Diagnosis not present

## 2016-09-06 DIAGNOSIS — C61 Malignant neoplasm of prostate: Secondary | ICD-10-CM | POA: Diagnosis not present

## 2016-09-06 DIAGNOSIS — Z0000 Encounter for general adult medical examination without abnormal findings: Secondary | ICD-10-CM | POA: Diagnosis not present

## 2016-09-06 DIAGNOSIS — E784 Other hyperlipidemia: Secondary | ICD-10-CM | POA: Diagnosis not present

## 2016-10-03 DIAGNOSIS — Z6831 Body mass index (BMI) 31.0-31.9, adult: Secondary | ICD-10-CM | POA: Diagnosis not present

## 2016-10-03 DIAGNOSIS — I10 Essential (primary) hypertension: Secondary | ICD-10-CM | POA: Diagnosis not present

## 2017-04-10 DIAGNOSIS — Z0100 Encounter for examination of eyes and vision without abnormal findings: Secondary | ICD-10-CM | POA: Diagnosis not present

## 2017-09-06 DIAGNOSIS — R82998 Other abnormal findings in urine: Secondary | ICD-10-CM | POA: Diagnosis not present

## 2017-09-06 DIAGNOSIS — I10 Essential (primary) hypertension: Secondary | ICD-10-CM | POA: Diagnosis not present

## 2017-09-06 DIAGNOSIS — E7849 Other hyperlipidemia: Secondary | ICD-10-CM | POA: Diagnosis not present

## 2017-09-14 DIAGNOSIS — Z23 Encounter for immunization: Secondary | ICD-10-CM | POA: Diagnosis not present

## 2017-09-14 DIAGNOSIS — I10 Essential (primary) hypertension: Secondary | ICD-10-CM | POA: Diagnosis not present

## 2017-09-14 DIAGNOSIS — Z0000 Encounter for general adult medical examination without abnormal findings: Secondary | ICD-10-CM | POA: Diagnosis not present

## 2017-09-14 DIAGNOSIS — F0280 Dementia in other diseases classified elsewhere without behavioral disturbance: Secondary | ICD-10-CM | POA: Diagnosis not present

## 2017-09-14 DIAGNOSIS — E7849 Other hyperlipidemia: Secondary | ICD-10-CM | POA: Diagnosis not present

## 2017-09-14 DIAGNOSIS — Z6832 Body mass index (BMI) 32.0-32.9, adult: Secondary | ICD-10-CM | POA: Diagnosis not present

## 2017-09-14 DIAGNOSIS — C61 Malignant neoplasm of prostate: Secondary | ICD-10-CM | POA: Diagnosis not present

## 2017-09-14 DIAGNOSIS — Z7901 Long term (current) use of anticoagulants: Secondary | ICD-10-CM | POA: Diagnosis not present

## 2018-09-12 DIAGNOSIS — E7849 Other hyperlipidemia: Secondary | ICD-10-CM | POA: Diagnosis not present

## 2018-09-12 DIAGNOSIS — R7301 Impaired fasting glucose: Secondary | ICD-10-CM | POA: Diagnosis not present

## 2018-09-12 DIAGNOSIS — R82998 Other abnormal findings in urine: Secondary | ICD-10-CM | POA: Diagnosis not present

## 2018-09-12 DIAGNOSIS — Z125 Encounter for screening for malignant neoplasm of prostate: Secondary | ICD-10-CM | POA: Diagnosis not present

## 2018-09-12 DIAGNOSIS — I10 Essential (primary) hypertension: Secondary | ICD-10-CM | POA: Diagnosis not present

## 2018-09-20 DIAGNOSIS — Z7901 Long term (current) use of anticoagulants: Secondary | ICD-10-CM | POA: Diagnosis not present

## 2018-09-20 DIAGNOSIS — Z86711 Personal history of pulmonary embolism: Secondary | ICD-10-CM | POA: Diagnosis not present

## 2018-09-20 DIAGNOSIS — E7849 Other hyperlipidemia: Secondary | ICD-10-CM | POA: Diagnosis not present

## 2018-09-20 DIAGNOSIS — G309 Alzheimer's disease, unspecified: Secondary | ICD-10-CM | POA: Diagnosis not present

## 2018-09-20 DIAGNOSIS — Z7189 Other specified counseling: Secondary | ICD-10-CM | POA: Diagnosis not present

## 2018-09-20 DIAGNOSIS — C61 Malignant neoplasm of prostate: Secondary | ICD-10-CM | POA: Diagnosis not present

## 2018-09-20 DIAGNOSIS — F0280 Dementia in other diseases classified elsewhere without behavioral disturbance: Secondary | ICD-10-CM | POA: Diagnosis not present

## 2018-09-20 DIAGNOSIS — Z23 Encounter for immunization: Secondary | ICD-10-CM | POA: Diagnosis not present

## 2018-09-20 DIAGNOSIS — I10 Essential (primary) hypertension: Secondary | ICD-10-CM | POA: Diagnosis not present

## 2018-09-20 DIAGNOSIS — Z0000 Encounter for general adult medical examination without abnormal findings: Secondary | ICD-10-CM | POA: Diagnosis not present

## 2018-10-29 DIAGNOSIS — Z13820 Encounter for screening for osteoporosis: Secondary | ICD-10-CM | POA: Diagnosis not present

## 2018-11-13 DIAGNOSIS — R338 Other retention of urine: Secondary | ICD-10-CM | POA: Diagnosis not present

## 2018-11-13 DIAGNOSIS — R972 Elevated prostate specific antigen [PSA]: Secondary | ICD-10-CM | POA: Diagnosis not present

## 2018-11-13 DIAGNOSIS — C61 Malignant neoplasm of prostate: Secondary | ICD-10-CM | POA: Diagnosis not present

## 2018-11-13 DIAGNOSIS — Z8546 Personal history of malignant neoplasm of prostate: Secondary | ICD-10-CM | POA: Diagnosis not present

## 2019-05-14 DIAGNOSIS — C61 Malignant neoplasm of prostate: Secondary | ICD-10-CM | POA: Diagnosis not present

## 2019-05-14 DIAGNOSIS — R972 Elevated prostate specific antigen [PSA]: Secondary | ICD-10-CM | POA: Diagnosis not present

## 2019-06-25 DIAGNOSIS — N390 Urinary tract infection, site not specified: Secondary | ICD-10-CM | POA: Diagnosis not present

## 2019-06-25 DIAGNOSIS — R300 Dysuria: Secondary | ICD-10-CM | POA: Diagnosis not present

## 2019-09-18 DIAGNOSIS — R310 Gross hematuria: Secondary | ICD-10-CM | POA: Diagnosis not present

## 2019-09-18 DIAGNOSIS — R7301 Impaired fasting glucose: Secondary | ICD-10-CM | POA: Diagnosis not present

## 2019-09-18 DIAGNOSIS — E7849 Other hyperlipidemia: Secondary | ICD-10-CM | POA: Diagnosis not present

## 2019-09-18 DIAGNOSIS — Z125 Encounter for screening for malignant neoplasm of prostate: Secondary | ICD-10-CM | POA: Diagnosis not present

## 2019-09-18 DIAGNOSIS — N390 Urinary tract infection, site not specified: Secondary | ICD-10-CM | POA: Diagnosis not present

## 2019-09-25 DIAGNOSIS — D6869 Other thrombophilia: Secondary | ICD-10-CM | POA: Diagnosis not present

## 2019-09-25 DIAGNOSIS — E785 Hyperlipidemia, unspecified: Secondary | ICD-10-CM | POA: Diagnosis not present

## 2019-09-25 DIAGNOSIS — R310 Gross hematuria: Secondary | ICD-10-CM | POA: Diagnosis not present

## 2019-09-25 DIAGNOSIS — E669 Obesity, unspecified: Secondary | ICD-10-CM | POA: Diagnosis not present

## 2019-09-25 DIAGNOSIS — F0280 Dementia in other diseases classified elsewhere without behavioral disturbance: Secondary | ICD-10-CM | POA: Diagnosis not present

## 2019-09-25 DIAGNOSIS — Z0000 Encounter for general adult medical examination without abnormal findings: Secondary | ICD-10-CM | POA: Diagnosis not present

## 2019-09-25 DIAGNOSIS — Z86711 Personal history of pulmonary embolism: Secondary | ICD-10-CM | POA: Diagnosis not present

## 2019-09-25 DIAGNOSIS — C61 Malignant neoplasm of prostate: Secondary | ICD-10-CM | POA: Diagnosis not present

## 2019-09-25 DIAGNOSIS — I10 Essential (primary) hypertension: Secondary | ICD-10-CM | POA: Diagnosis not present

## 2019-09-26 DIAGNOSIS — Z23 Encounter for immunization: Secondary | ICD-10-CM | POA: Diagnosis not present

## 2019-10-15 DIAGNOSIS — R8290 Unspecified abnormal findings in urine: Secondary | ICD-10-CM | POA: Diagnosis not present

## 2019-10-15 DIAGNOSIS — I10 Essential (primary) hypertension: Secondary | ICD-10-CM | POA: Diagnosis not present

## 2019-11-05 DIAGNOSIS — Z7189 Other specified counseling: Secondary | ICD-10-CM | POA: Diagnosis not present

## 2019-11-05 DIAGNOSIS — K5900 Constipation, unspecified: Secondary | ICD-10-CM | POA: Diagnosis not present

## 2019-11-05 DIAGNOSIS — R82998 Other abnormal findings in urine: Secondary | ICD-10-CM | POA: Diagnosis not present

## 2019-11-05 DIAGNOSIS — N390 Urinary tract infection, site not specified: Secondary | ICD-10-CM | POA: Diagnosis not present

## 2019-11-05 DIAGNOSIS — R350 Frequency of micturition: Secondary | ICD-10-CM | POA: Diagnosis not present

## 2019-11-05 DIAGNOSIS — F0280 Dementia in other diseases classified elsewhere without behavioral disturbance: Secondary | ICD-10-CM | POA: Diagnosis not present

## 2019-11-05 DIAGNOSIS — I10 Essential (primary) hypertension: Secondary | ICD-10-CM | POA: Diagnosis not present

## 2019-11-05 DIAGNOSIS — D473 Essential (hemorrhagic) thrombocythemia: Secondary | ICD-10-CM | POA: Diagnosis not present

## 2019-11-05 DIAGNOSIS — C61 Malignant neoplasm of prostate: Secondary | ICD-10-CM | POA: Diagnosis not present

## 2019-11-05 DIAGNOSIS — R531 Weakness: Secondary | ICD-10-CM | POA: Diagnosis not present

## 2019-11-07 ENCOUNTER — Telehealth: Payer: Self-pay

## 2019-11-07 ENCOUNTER — Telehealth: Payer: Self-pay | Admitting: Internal Medicine

## 2019-11-07 NOTE — Telephone Encounter (Signed)
Scheduled [facility] palliative appt for 11-18-19 at 2:00.

## 2019-11-07 NOTE — Telephone Encounter (Signed)
Received new referral for Palliative Care from Claud Kelp NP.Per Ria Comment, patient has had a weight loss of 30 pounds, frequent falls, and UTI's.

## 2019-11-12 DIAGNOSIS — R82998 Other abnormal findings in urine: Secondary | ICD-10-CM | POA: Diagnosis not present

## 2019-11-12 DIAGNOSIS — D473 Essential (hemorrhagic) thrombocythemia: Secondary | ICD-10-CM | POA: Diagnosis not present

## 2019-11-12 DIAGNOSIS — R2681 Unsteadiness on feet: Secondary | ICD-10-CM | POA: Diagnosis not present

## 2019-11-12 DIAGNOSIS — K5900 Constipation, unspecified: Secondary | ICD-10-CM | POA: Diagnosis not present

## 2019-11-12 DIAGNOSIS — I10 Essential (primary) hypertension: Secondary | ICD-10-CM | POA: Diagnosis not present

## 2019-11-12 DIAGNOSIS — R531 Weakness: Secondary | ICD-10-CM | POA: Diagnosis not present

## 2019-11-12 DIAGNOSIS — F0280 Dementia in other diseases classified elsewhere without behavioral disturbance: Secondary | ICD-10-CM | POA: Diagnosis not present

## 2019-11-12 DIAGNOSIS — N390 Urinary tract infection, site not specified: Secondary | ICD-10-CM | POA: Diagnosis not present

## 2019-11-13 DIAGNOSIS — D473 Essential (hemorrhagic) thrombocythemia: Secondary | ICD-10-CM | POA: Diagnosis not present

## 2019-11-14 DIAGNOSIS — Z8546 Personal history of malignant neoplasm of prostate: Secondary | ICD-10-CM | POA: Diagnosis not present

## 2019-11-14 DIAGNOSIS — R972 Elevated prostate specific antigen [PSA]: Secondary | ICD-10-CM | POA: Diagnosis not present

## 2019-11-17 NOTE — Progress Notes (Signed)
[place] Consult Note Telephone: [phone]  Fax: [phone]  PATIENT NAME: Alexander Cole DOB: May 25, 1926 MRN: [id number] [address] [place] [phone] Phone: [phone]  PRIMARY CARE PROVIDER:    Marton Redwood, MD/Lindsey [facility]ward NP [place],  [place] [address]  Dr. Alona Bene ([facility])  REFERRING PROVIDER:  Marton Redwood, MD/[facility] NP  RESPONSIBLE PARTY: (Spouse/HCPOA) [place], Two sons, Paityn Phimmasone [facility], Yazir Nannini [phone].  ASSESSMENT / RECOMMENDATIONS:  1. Advance Care Planning:  A. Directives: DNR in the home; I'll take a pic and upload into [facility]. Reviewed sections of the MOST form; signed and uploaded into [facility] EMR. Details: DNR/DNI. Comfort level for Scope of Medical Interventions. Yes to Antibiotics and IVFs. Not to Tube Feedings.  B. Goals of Care: Improve functional ability and strength as much as possible.  2.Cognitive / Functional status: FAST 6d. Able to hold a simple appropriate conversation but doesn't initiate. Very forgetful. Recognizes family members. Has forgotten how to shave. Spouse needs to prompt to toilet. Often incontinent of urine, with some urinary hesitancy. Sedentary; tends to just stay in recliner. Ambulates without use of assistive devices, but wife has walker on order. Shower chair available. Increasing difficulty going up stairs, so spouse moving bedroom to downstairs finished basement area. Had a fall about 1 month ago without injury; believes misjudged sitting down on a chair.  They are awaiting delivery of a hospital bed. Has a bed side commode. Currently patient sleeping on sofa, and spouse in recliner, but they both sleep well with this arrangement. Patient is independent in transfers, needs assist to dress and with personal hygiene. No pain. Skin intact. Good appetite consuming 100%  of 3 light meals/day. Has lost about 30 lbs over the last year to a current weight of 200lbs. At 6'3" his BMI is 25kg/m2. No LE edema off of HCTZ.  -I left and reviewed low impact exercises for patient; 10 sets each up to tid.  3. Family Supports: Resides with spouse [name]. 2 sons [name] and [name]) who live in the area and visit q weekend for dinner; very involved in care. Spouse recently rehired Environmental consultant from [employer] for bath 3x/week.   4. Follow up Palliative Care Visit: wife [name] will call on a prn basis. I left her my contact information.  I spent 60 minutes providing this consultation from 2pm to 3pm. More than 50% of the time in this consultation was spent coordinating communication.   HISTORY OF PRESENT ILLNESS:  Alexander Cole is a 86 y.o. male with h/o prostate cancer (TURP; ongoing treatment with hormone Rx when PSA >10, as therapy leads to weakness/debilitation), UTI. HTN. Palliative Care was asked to help address goals of care.   CODE STATUS: DNR  PPS: 50%  HOSPICE ELIGIBILITY/DIAGNOSIS: TBD  PAST MEDICAL HISTORY:  Past Medical History:  Diagnosis Date  . DVT (deep venous thrombosis)    LLE/notes 03/04/2013 (03/05/2013)  . Hyperlipidemia   . Hypertension   . Prostate cancer    "no surgery; radiation" (03/05/2013)  . Pulmonary embolus 04/09/2013    SOCIAL HX:  Social History   Tobacco Use  . Smoking status: Former Smoker    Years: 1.00    Types: Cigarettes    Quit date: 10/18/1951    Years since quitting: 68.1  . Smokeless tobacco: Never Used  . Tobacco comment: 03/05/2013 "smoked very little"   Substance Use Topics  .  Alcohol use: No    ALLERGIES: No Known Allergies   PERTINENT MEDICATIONS:  Outpatient Encounter Medications as of 11/18/2019  Medication Sig  . feeding supplement (ENSURE COMPLETE) LIQD Take 237 mLs by mouth 2 (two) times daily between meals.  . Multiple Vitamin (MULTIVITAMIN WITH MINERALS) TABS Take 1 tablet by mouth daily.  .  Rivaroxaban (XARELTO) 20 MG TABS Take 1 tablet (20 mg total) by mouth daily with supper.   No facility-administered encounter medications on file as of 11/18/2019.    PHYSICAL EXAM:   Well nourished, pleasantly conversant. Observed to self-transfer and ambulate with fairly steady gait. Wife in attendance. PE deferred to decreased risk of COVID transmission Ext: no joint deformities. No LE edema. Neurological: non-focal  Julianne Handler, NP

## 2019-11-18 ENCOUNTER — Other Ambulatory Visit: Payer: Self-pay

## 2019-11-18 ENCOUNTER — Encounter: Payer: Self-pay | Admitting: Internal Medicine

## 2019-11-18 ENCOUNTER — Other Ambulatory Visit: Payer: Medicare HMO | Admitting: Internal Medicine

## 2019-11-18 DIAGNOSIS — Z515 Encounter for palliative care: Secondary | ICD-10-CM

## 2019-11-18 DIAGNOSIS — Z7189 Other specified counseling: Secondary | ICD-10-CM

## 2019-11-20 DIAGNOSIS — F0280 Dementia in other diseases classified elsewhere without behavioral disturbance: Secondary | ICD-10-CM | POA: Diagnosis not present

## 2019-11-20 DIAGNOSIS — R350 Frequency of micturition: Secondary | ICD-10-CM | POA: Diagnosis not present

## 2019-11-20 DIAGNOSIS — G309 Alzheimer's disease, unspecified: Secondary | ICD-10-CM | POA: Diagnosis not present

## 2019-11-20 DIAGNOSIS — K5900 Constipation, unspecified: Secondary | ICD-10-CM | POA: Diagnosis not present

## 2019-11-20 DIAGNOSIS — N390 Urinary tract infection, site not specified: Secondary | ICD-10-CM | POA: Diagnosis not present

## 2019-11-20 DIAGNOSIS — E785 Hyperlipidemia, unspecified: Secondary | ICD-10-CM | POA: Diagnosis not present

## 2019-11-20 DIAGNOSIS — N401 Enlarged prostate with lower urinary tract symptoms: Secondary | ICD-10-CM | POA: Diagnosis not present

## 2019-11-20 DIAGNOSIS — I10 Essential (primary) hypertension: Secondary | ICD-10-CM | POA: Diagnosis not present

## 2019-11-20 DIAGNOSIS — C61 Malignant neoplasm of prostate: Secondary | ICD-10-CM | POA: Diagnosis not present

## 2019-11-26 DIAGNOSIS — N401 Enlarged prostate with lower urinary tract symptoms: Secondary | ICD-10-CM | POA: Diagnosis not present

## 2019-11-26 DIAGNOSIS — I10 Essential (primary) hypertension: Secondary | ICD-10-CM | POA: Diagnosis not present

## 2019-11-26 DIAGNOSIS — R300 Dysuria: Secondary | ICD-10-CM | POA: Diagnosis not present

## 2019-11-26 DIAGNOSIS — R350 Frequency of micturition: Secondary | ICD-10-CM | POA: Diagnosis not present

## 2019-11-26 DIAGNOSIS — K5900 Constipation, unspecified: Secondary | ICD-10-CM | POA: Diagnosis not present

## 2019-11-26 DIAGNOSIS — N390 Urinary tract infection, site not specified: Secondary | ICD-10-CM | POA: Diagnosis not present

## 2019-11-26 DIAGNOSIS — C61 Malignant neoplasm of prostate: Secondary | ICD-10-CM | POA: Diagnosis not present

## 2019-11-26 DIAGNOSIS — E785 Hyperlipidemia, unspecified: Secondary | ICD-10-CM | POA: Diagnosis not present

## 2019-11-26 DIAGNOSIS — G309 Alzheimer's disease, unspecified: Secondary | ICD-10-CM | POA: Diagnosis not present

## 2019-11-26 DIAGNOSIS — F0280 Dementia in other diseases classified elsewhere without behavioral disturbance: Secondary | ICD-10-CM | POA: Diagnosis not present

## 2019-11-27 DIAGNOSIS — R531 Weakness: Secondary | ICD-10-CM | POA: Diagnosis not present

## 2019-11-27 DIAGNOSIS — R2681 Unsteadiness on feet: Secondary | ICD-10-CM | POA: Diagnosis not present

## 2019-11-28 DIAGNOSIS — K5900 Constipation, unspecified: Secondary | ICD-10-CM | POA: Diagnosis not present

## 2019-11-28 DIAGNOSIS — C61 Malignant neoplasm of prostate: Secondary | ICD-10-CM | POA: Diagnosis not present

## 2019-11-28 DIAGNOSIS — E785 Hyperlipidemia, unspecified: Secondary | ICD-10-CM | POA: Diagnosis not present

## 2019-11-28 DIAGNOSIS — R350 Frequency of micturition: Secondary | ICD-10-CM | POA: Diagnosis not present

## 2019-11-28 DIAGNOSIS — N401 Enlarged prostate with lower urinary tract symptoms: Secondary | ICD-10-CM | POA: Diagnosis not present

## 2019-11-28 DIAGNOSIS — N390 Urinary tract infection, site not specified: Secondary | ICD-10-CM | POA: Diagnosis not present

## 2019-11-28 DIAGNOSIS — I10 Essential (primary) hypertension: Secondary | ICD-10-CM | POA: Diagnosis not present

## 2019-11-28 DIAGNOSIS — F0280 Dementia in other diseases classified elsewhere without behavioral disturbance: Secondary | ICD-10-CM | POA: Diagnosis not present

## 2019-11-28 DIAGNOSIS — G309 Alzheimer's disease, unspecified: Secondary | ICD-10-CM | POA: Diagnosis not present

## 2019-12-02 DIAGNOSIS — R350 Frequency of micturition: Secondary | ICD-10-CM | POA: Diagnosis not present

## 2019-12-02 DIAGNOSIS — N401 Enlarged prostate with lower urinary tract symptoms: Secondary | ICD-10-CM | POA: Diagnosis not present

## 2019-12-02 DIAGNOSIS — N390 Urinary tract infection, site not specified: Secondary | ICD-10-CM | POA: Diagnosis not present

## 2019-12-02 DIAGNOSIS — G309 Alzheimer's disease, unspecified: Secondary | ICD-10-CM | POA: Diagnosis not present

## 2019-12-02 DIAGNOSIS — F0280 Dementia in other diseases classified elsewhere without behavioral disturbance: Secondary | ICD-10-CM | POA: Diagnosis not present

## 2019-12-02 DIAGNOSIS — C61 Malignant neoplasm of prostate: Secondary | ICD-10-CM | POA: Diagnosis not present

## 2019-12-02 DIAGNOSIS — E785 Hyperlipidemia, unspecified: Secondary | ICD-10-CM | POA: Diagnosis not present

## 2019-12-02 DIAGNOSIS — I10 Essential (primary) hypertension: Secondary | ICD-10-CM | POA: Diagnosis not present

## 2019-12-02 DIAGNOSIS — K5900 Constipation, unspecified: Secondary | ICD-10-CM | POA: Diagnosis not present

## 2019-12-04 DIAGNOSIS — K5900 Constipation, unspecified: Secondary | ICD-10-CM | POA: Diagnosis not present

## 2019-12-04 DIAGNOSIS — N390 Urinary tract infection, site not specified: Secondary | ICD-10-CM | POA: Diagnosis not present

## 2019-12-04 DIAGNOSIS — N401 Enlarged prostate with lower urinary tract symptoms: Secondary | ICD-10-CM | POA: Diagnosis not present

## 2019-12-04 DIAGNOSIS — I10 Essential (primary) hypertension: Secondary | ICD-10-CM | POA: Diagnosis not present

## 2019-12-04 DIAGNOSIS — G309 Alzheimer's disease, unspecified: Secondary | ICD-10-CM | POA: Diagnosis not present

## 2019-12-04 DIAGNOSIS — R350 Frequency of micturition: Secondary | ICD-10-CM | POA: Diagnosis not present

## 2019-12-04 DIAGNOSIS — F0280 Dementia in other diseases classified elsewhere without behavioral disturbance: Secondary | ICD-10-CM | POA: Diagnosis not present

## 2019-12-04 DIAGNOSIS — E785 Hyperlipidemia, unspecified: Secondary | ICD-10-CM | POA: Diagnosis not present

## 2019-12-04 DIAGNOSIS — C61 Malignant neoplasm of prostate: Secondary | ICD-10-CM | POA: Diagnosis not present

## 2019-12-09 DIAGNOSIS — E785 Hyperlipidemia, unspecified: Secondary | ICD-10-CM | POA: Diagnosis not present

## 2019-12-09 DIAGNOSIS — K5900 Constipation, unspecified: Secondary | ICD-10-CM | POA: Diagnosis not present

## 2019-12-09 DIAGNOSIS — N401 Enlarged prostate with lower urinary tract symptoms: Secondary | ICD-10-CM | POA: Diagnosis not present

## 2019-12-09 DIAGNOSIS — R350 Frequency of micturition: Secondary | ICD-10-CM | POA: Diagnosis not present

## 2019-12-09 DIAGNOSIS — G309 Alzheimer's disease, unspecified: Secondary | ICD-10-CM | POA: Diagnosis not present

## 2019-12-09 DIAGNOSIS — I10 Essential (primary) hypertension: Secondary | ICD-10-CM | POA: Diagnosis not present

## 2019-12-09 DIAGNOSIS — C61 Malignant neoplasm of prostate: Secondary | ICD-10-CM | POA: Diagnosis not present

## 2019-12-09 DIAGNOSIS — N390 Urinary tract infection, site not specified: Secondary | ICD-10-CM | POA: Diagnosis not present

## 2019-12-09 DIAGNOSIS — F0280 Dementia in other diseases classified elsewhere without behavioral disturbance: Secondary | ICD-10-CM | POA: Diagnosis not present

## 2019-12-11 DIAGNOSIS — G309 Alzheimer's disease, unspecified: Secondary | ICD-10-CM | POA: Diagnosis not present

## 2019-12-11 DIAGNOSIS — N390 Urinary tract infection, site not specified: Secondary | ICD-10-CM | POA: Diagnosis not present

## 2019-12-11 DIAGNOSIS — K5900 Constipation, unspecified: Secondary | ICD-10-CM | POA: Diagnosis not present

## 2019-12-11 DIAGNOSIS — C61 Malignant neoplasm of prostate: Secondary | ICD-10-CM | POA: Diagnosis not present

## 2019-12-11 DIAGNOSIS — N401 Enlarged prostate with lower urinary tract symptoms: Secondary | ICD-10-CM | POA: Diagnosis not present

## 2019-12-11 DIAGNOSIS — I10 Essential (primary) hypertension: Secondary | ICD-10-CM | POA: Diagnosis not present

## 2019-12-11 DIAGNOSIS — F0280 Dementia in other diseases classified elsewhere without behavioral disturbance: Secondary | ICD-10-CM | POA: Diagnosis not present

## 2019-12-11 DIAGNOSIS — E785 Hyperlipidemia, unspecified: Secondary | ICD-10-CM | POA: Diagnosis not present

## 2019-12-11 DIAGNOSIS — R350 Frequency of micturition: Secondary | ICD-10-CM | POA: Diagnosis not present

## 2019-12-16 DIAGNOSIS — C61 Malignant neoplasm of prostate: Secondary | ICD-10-CM | POA: Diagnosis not present

## 2019-12-16 DIAGNOSIS — G309 Alzheimer's disease, unspecified: Secondary | ICD-10-CM | POA: Diagnosis not present

## 2019-12-16 DIAGNOSIS — R350 Frequency of micturition: Secondary | ICD-10-CM | POA: Diagnosis not present

## 2019-12-16 DIAGNOSIS — F0280 Dementia in other diseases classified elsewhere without behavioral disturbance: Secondary | ICD-10-CM | POA: Diagnosis not present

## 2019-12-16 DIAGNOSIS — N390 Urinary tract infection, site not specified: Secondary | ICD-10-CM | POA: Diagnosis not present

## 2019-12-16 DIAGNOSIS — N401 Enlarged prostate with lower urinary tract symptoms: Secondary | ICD-10-CM | POA: Diagnosis not present

## 2019-12-16 DIAGNOSIS — I10 Essential (primary) hypertension: Secondary | ICD-10-CM | POA: Diagnosis not present

## 2019-12-16 DIAGNOSIS — E785 Hyperlipidemia, unspecified: Secondary | ICD-10-CM | POA: Diagnosis not present

## 2019-12-16 DIAGNOSIS — K5900 Constipation, unspecified: Secondary | ICD-10-CM | POA: Diagnosis not present

## 2019-12-17 DIAGNOSIS — I10 Essential (primary) hypertension: Secondary | ICD-10-CM | POA: Diagnosis not present

## 2019-12-17 DIAGNOSIS — G309 Alzheimer's disease, unspecified: Secondary | ICD-10-CM | POA: Diagnosis not present

## 2019-12-17 DIAGNOSIS — E785 Hyperlipidemia, unspecified: Secondary | ICD-10-CM | POA: Diagnosis not present

## 2019-12-17 DIAGNOSIS — R350 Frequency of micturition: Secondary | ICD-10-CM | POA: Diagnosis not present

## 2019-12-17 DIAGNOSIS — F0280 Dementia in other diseases classified elsewhere without behavioral disturbance: Secondary | ICD-10-CM | POA: Diagnosis not present

## 2019-12-17 DIAGNOSIS — N401 Enlarged prostate with lower urinary tract symptoms: Secondary | ICD-10-CM | POA: Diagnosis not present

## 2019-12-17 DIAGNOSIS — N390 Urinary tract infection, site not specified: Secondary | ICD-10-CM | POA: Diagnosis not present

## 2019-12-17 DIAGNOSIS — C61 Malignant neoplasm of prostate: Secondary | ICD-10-CM | POA: Diagnosis not present

## 2019-12-17 DIAGNOSIS — K5900 Constipation, unspecified: Secondary | ICD-10-CM | POA: Diagnosis not present

## 2019-12-23 DIAGNOSIS — N390 Urinary tract infection, site not specified: Secondary | ICD-10-CM | POA: Diagnosis not present

## 2019-12-25 DIAGNOSIS — R531 Weakness: Secondary | ICD-10-CM | POA: Diagnosis not present

## 2019-12-25 DIAGNOSIS — R2681 Unsteadiness on feet: Secondary | ICD-10-CM | POA: Diagnosis not present

## 2019-12-26 ENCOUNTER — Other Ambulatory Visit: Payer: Self-pay

## 2019-12-26 ENCOUNTER — Encounter: Payer: Self-pay | Admitting: Internal Medicine

## 2019-12-26 ENCOUNTER — Other Ambulatory Visit: Payer: Medicare HMO | Admitting: Internal Medicine

## 2019-12-26 DIAGNOSIS — Z515 Encounter for palliative care: Secondary | ICD-10-CM | POA: Diagnosis not present

## 2019-12-26 DIAGNOSIS — Z7189 Other specified counseling: Secondary | ICD-10-CM

## 2019-12-26 NOTE — Progress Notes (Signed)
March 11th, 2021 [facility] Consult Note Telephone: [phone]  Fax: [phone]   PATIENT NAME: Alexander Cole) TERI [facility] DOB: 23-Oct-1925 MRN: [id number] [address] [place] [phone] Phone: [phone]   PRIMARY CARE PROVIDER:    Marton Redwood, MD/Lindsey [facility] NP [place],  [place] [address]   Dr. Alona Bene ([facility])   REFERRING PROVIDER:  Marton Redwood, MD/Lindsey Truman Hayward NP   RESPONSIBLE PARTY: (Spouse/HCPOA) [name], Two sons, [name] [facility], [name] [phone].  ASSESSMENT / RECOMMENDATIONS:  1. Advance Care Planning:             A. Directives: DNR. MOST: DNR/DNI. Comfort level for Scope of Medical Interventions. Yes to Antibiotics and IVFs. Not to Tube Feedings.             B. Goals of Care: Conservative management. No wish for aggressive work ups or hospitalizations. If meets hospice eligibility criteria would consider referral.    2. Cognitive / Functional status:  -FAST 6d. Continues able to hold a simple appropriate conversation but doesn't initiate. A & O to self, place. Recognizes his family members. Very forgetful. Needs much cuing and tactile assist to stand.   -Dependent for all ADLs. Over the last 10 days had a decline in status, with inability to ambulate (one person assist to transfer/pivot to wheelchair), increased somnolence (only communicated when directly addressed), and decline in oral intake so that he is only drinking bone broth and V-8 juice. Patient stated decreased appetite/early satiety. He has maintained his weight at about 200lbs (weighed today). He has lost about 30 lbs from last year. At 6'3" his BMI is 25kg/m2. Two weeks earlier was ambulating with PT with and without walker. He has since been treated for a UTI (Nitrofurantoin 100mg  bid x 7d; 4 days left) and has had 2 good BM. This morning finds  him much improved, about at his cognitive baseline. He tells me he is hungry. Ongoing weakness, still needing assist to stand and is too weak/shaky to ambulate. No recent falls. They have a hospital bed but patient prefers to sleep in his recliner or on the sofa. Spouse stays in same room at night, sleeping in recliner. They are awaiting delivery of a lift chair. Spouse has been transporting patient to BR via wheelchair, then he needs to take a few steps to reach the toilet, hanging onto the towel bar. Patient's son who has some carpentry skills is stopping by to widen the BR door, and to install grip bars. Spouse doesn't want a bedside commode in the same area they are currently living/sleeping in but would consider if patient decompensates to point needs to move into the bedroom where the hospital bed is.   -Spouse notes an area of abdominal firmness, which she doesn't believe is new. Wonders if r/t recent bout of constipation vs tumor. She does not desire any evaluation.  3. Family Supports: Resides with spouse [name]. 2 sons [name] and [name]) who live in the area and visit q weekend for dinner; very involved in care. [facility] for bath/personal care 3x/week (Mon-Wed-Thurs). Spouse feels adequate coverage at this time, as long as patient can transfer with just 1 assist.     4. Follow up Palliative Care Visit: Mon 01/13/2020 @ 1pm I spent 60 minutes providing this consultation from 2pm to 3pm. More than 50% of the time in this consultation was spent coordinating communication.  HISTORY OF PRESENT ILLNESS:  Alexander Cole is a 86 y.o. male with h/o prostate cancer (TURP; ongoing treatment with hormone Rx when PSA >10, as therapy leads to weakness/debilitation), UTI. HTN. This is a f/u Palliative Care from 11/18/2019  CODE STATUS: DNR   PPS: 30% (from 50%)   HOSPICE ELIGIBILITY/DIAGNOSIS: TBD  PAST MEDICAL HISTORY:  Past Medical History:  Diagnosis Date  . DVT (deep venous  thrombosis) ([place])    LLE/notes 03/04/2013 (03/05/2013)  . Hyperlipidemia   . Hypertension   . Prostate cancer [facility])    "no surgery; radiation" (03/05/2013)  . Pulmonary embolus ([place]) 04/09/2013    SOCIAL HX:  Social History   Tobacco Use  . Smoking status: Former Smoker    Years: 1.00    Types: Cigarettes    Quit date: 10/18/1951    Years since quitting: 68.2  . Smokeless tobacco: Never Used  . Tobacco comment: 03/05/2013 "smoked very little"   Substance Use Topics  . Alcohol use: No    ALLERGIES: No Known Allergies   PERTINENT MEDICATIONS:  Outpatient Encounter Medications as of 12/26/2019  Medication Sig  . donepezil (ARICEPT) 10 MG tablet Take 10 mg by mouth at bedtime.  . feeding supplement (ENSURE COMPLETE) LIQD Take 237 mLs by mouth 2 (two) times daily between meals. (Patient not taking: Reported on 11/18/2019)  . irbesartan (AVAPRO) 150 MG tablet Take 150 mg by mouth daily.  . Multiple Vitamin (MULTIVITAMIN WITH MINERALS) TABS Take 1 tablet by mouth daily.  Marland Kitchen OVER THE COUNTER MEDICATION Vitamin B  . OVER THE COUNTER MEDICATION Vitamin D  . Rivaroxaban (XARELTO) 20 MG TABS Take 1 tablet (20 mg total) by mouth daily with supper. (Patient taking differently: Take 10 mg by mouth daily with supper. )   No facility-administered encounter medications on file as of 12/26/2019.    PHYSICAL EXAM:   BP 140/64 HR 88, RR 22. Weight 200lbs  Eyes initially closed, alert quickly to verbal stim. Pleasant. A & O to person, place Slight LE swelling to lower calf mildly pitting. ABD: Mid/LL abdominal firmness/hardness, non-tender. About 7" wide, 3" high . NABS. Slight left lower abd tenderness to applied pressure. No rebound tenderness Chest: Bibasilar insp crackles that clear somewhat with deep breath/cough. Cardiac: RRR without MRG  Julianne Handler, NP

## 2019-12-30 DIAGNOSIS — N390 Urinary tract infection, site not specified: Secondary | ICD-10-CM | POA: Diagnosis not present

## 2019-12-30 DIAGNOSIS — R8290 Unspecified abnormal findings in urine: Secondary | ICD-10-CM | POA: Diagnosis not present

## 2020-01-02 ENCOUNTER — Telehealth: Payer: Self-pay | Admitting: Internal Medicine

## 2020-01-02 NOTE — Telephone Encounter (Signed)
11am:   TC from PCG spouse [name], who wished to notify me that she was discontinuing Palliative Care Services, and was going to pursue Hospice Care through [facility]. I mentioned that I wasn't sure that [facility] provided Hospice services; that I believed that they were a home care agency. I asked how she felt about continuing PT or OT services for her husbance, and she said that she felt she was done with all of that. I mentioned that if she wished to pursue a hospice referral that I could assist her, but she said she was fine with [facility] making those arrangements. I asked her to call me if that didn't work out, and I could assist.   Violeta Gelinas NP-C [phone]

## 2020-01-25 DIAGNOSIS — R2681 Unsteadiness on feet: Secondary | ICD-10-CM | POA: Diagnosis not present

## 2020-01-25 DIAGNOSIS — R531 Weakness: Secondary | ICD-10-CM | POA: Diagnosis not present

## 2020-02-15 DEATH — deceased
